# Patient Record
Sex: Female | Born: 1955 | Race: White | Hispanic: No | Marital: Married | Smoking: Never smoker
Health system: Southern US, Community
[De-identification: ages and names within clinical notes are randomized; demographics above are authoritative.]

## PROBLEM LIST (undated history)

## (undated) DIAGNOSIS — M1990 Unspecified osteoarthritis, unspecified site: Secondary | ICD-10-CM

## (undated) DIAGNOSIS — N871 Moderate cervical dysplasia: Secondary | ICD-10-CM

## (undated) DIAGNOSIS — K219 Gastro-esophageal reflux disease without esophagitis: Secondary | ICD-10-CM

## (undated) DIAGNOSIS — Z8489 Family history of other specified conditions: Secondary | ICD-10-CM

## (undated) DIAGNOSIS — C4490 Unspecified malignant neoplasm of skin, unspecified: Secondary | ICD-10-CM

## (undated) DIAGNOSIS — H9319 Tinnitus, unspecified ear: Secondary | ICD-10-CM

## (undated) HISTORY — DX: Tinnitus, unspecified ear: H93.19

## (undated) HISTORY — DX: Unspecified osteoarthritis, unspecified site: M19.90

## (undated) HISTORY — PX: TUBAL LIGATION: SHX77

## (undated) HISTORY — PX: SKIN CANCER EXCISION: SHX779

## (undated) HISTORY — DX: Moderate cervical dysplasia: N87.1

---

## 1992-03-09 DIAGNOSIS — N871 Moderate cervical dysplasia: Secondary | ICD-10-CM

## 1992-03-09 HISTORY — PX: CERVICAL BIOPSY  W/ LOOP ELECTRODE EXCISION: SUR135

## 1992-03-09 HISTORY — DX: Moderate cervical dysplasia: N87.1

## 1992-03-09 HISTORY — PX: CERVICAL CONE BIOPSY: SUR198

## 1998-03-09 HISTORY — PX: HYSTEROSCOPY: SHX211

## 1998-06-04 ENCOUNTER — Other Ambulatory Visit: Admission: RE | Admit: 1998-06-04 | Discharge: 1998-06-04 | Payer: Self-pay | Admitting: Obstetrics and Gynecology

## 1999-02-25 ENCOUNTER — Ambulatory Visit (HOSPITAL_COMMUNITY): Admission: RE | Admit: 1999-02-25 | Discharge: 1999-02-25 | Payer: Self-pay | Admitting: Gynecology

## 1999-02-25 ENCOUNTER — Encounter (INDEPENDENT_AMBULATORY_CARE_PROVIDER_SITE_OTHER): Payer: Self-pay

## 1999-07-29 ENCOUNTER — Other Ambulatory Visit: Admission: RE | Admit: 1999-07-29 | Discharge: 1999-07-29 | Payer: Self-pay | Admitting: Gynecology

## 2000-03-09 HISTORY — PX: AUGMENTATION MAMMAPLASTY: SUR837

## 2000-07-07 ENCOUNTER — Other Ambulatory Visit: Admission: RE | Admit: 2000-07-07 | Discharge: 2000-07-07 | Payer: Self-pay | Admitting: Gynecology

## 2001-07-14 ENCOUNTER — Other Ambulatory Visit: Admission: RE | Admit: 2001-07-14 | Discharge: 2001-07-14 | Payer: Self-pay | Admitting: Gynecology

## 2002-08-18 ENCOUNTER — Other Ambulatory Visit: Admission: RE | Admit: 2002-08-18 | Discharge: 2002-08-18 | Payer: Self-pay | Admitting: Gynecology

## 2003-09-18 ENCOUNTER — Other Ambulatory Visit: Admission: RE | Admit: 2003-09-18 | Discharge: 2003-09-18 | Payer: Self-pay | Admitting: Gynecology

## 2004-03-09 HISTORY — PX: KNEE ARTHROSCOPY: SUR90

## 2004-04-23 ENCOUNTER — Encounter: Admission: RE | Admit: 2004-04-23 | Discharge: 2004-04-23 | Payer: Self-pay | Admitting: Gynecology

## 2004-10-06 ENCOUNTER — Other Ambulatory Visit: Admission: RE | Admit: 2004-10-06 | Discharge: 2004-10-06 | Payer: Self-pay | Admitting: Gynecology

## 2004-12-03 ENCOUNTER — Ambulatory Visit (HOSPITAL_COMMUNITY): Admission: RE | Admit: 2004-12-03 | Discharge: 2004-12-03 | Payer: Self-pay | Admitting: Sports Medicine

## 2005-05-07 ENCOUNTER — Encounter: Admission: RE | Admit: 2005-05-07 | Discharge: 2005-05-07 | Payer: Self-pay | Admitting: Gynecology

## 2005-05-27 ENCOUNTER — Encounter: Admission: RE | Admit: 2005-05-27 | Discharge: 2005-05-27 | Payer: Self-pay | Admitting: Gynecology

## 2005-10-20 ENCOUNTER — Other Ambulatory Visit: Admission: RE | Admit: 2005-10-20 | Discharge: 2005-10-20 | Payer: Self-pay | Admitting: Gynecology

## 2005-11-10 ENCOUNTER — Encounter: Admission: RE | Admit: 2005-11-10 | Discharge: 2005-11-10 | Payer: Self-pay | Admitting: Gynecology

## 2006-11-15 ENCOUNTER — Encounter: Admission: RE | Admit: 2006-11-15 | Discharge: 2006-11-15 | Payer: Self-pay | Admitting: Obstetrics and Gynecology

## 2006-11-26 ENCOUNTER — Other Ambulatory Visit: Admission: RE | Admit: 2006-11-26 | Discharge: 2006-11-26 | Payer: Self-pay | Admitting: Obstetrics and Gynecology

## 2006-12-17 ENCOUNTER — Ambulatory Visit: Payer: Self-pay | Admitting: Internal Medicine

## 2006-12-30 ENCOUNTER — Ambulatory Visit: Payer: Self-pay | Admitting: Internal Medicine

## 2006-12-30 HISTORY — PX: COLONOSCOPY: SHX174

## 2007-11-17 ENCOUNTER — Encounter: Admission: RE | Admit: 2007-11-17 | Discharge: 2007-11-17 | Payer: Self-pay | Admitting: Obstetrics and Gynecology

## 2007-11-30 ENCOUNTER — Other Ambulatory Visit: Admission: RE | Admit: 2007-11-30 | Discharge: 2007-11-30 | Payer: Self-pay | Admitting: Obstetrics and Gynecology

## 2008-11-21 ENCOUNTER — Encounter: Admission: RE | Admit: 2008-11-21 | Discharge: 2008-11-21 | Payer: Self-pay | Admitting: Obstetrics and Gynecology

## 2009-11-25 ENCOUNTER — Encounter: Admission: RE | Admit: 2009-11-25 | Discharge: 2009-11-25 | Payer: Self-pay | Admitting: Obstetrics and Gynecology

## 2009-11-28 ENCOUNTER — Encounter: Admission: RE | Admit: 2009-11-28 | Discharge: 2009-11-28 | Payer: Self-pay | Admitting: Obstetrics and Gynecology

## 2009-11-28 IMAGING — US US BREAST L
1 series · 4 of 4 positions shown · non-contrast
Comparison: [DATE], [DATE], [DATE], [DATE],
[DATE].

CLINICAL DATA: Possible mass left breast identified on the implant
push back CC view of the recent screening mammogram.

DIGITAL DIAGNOSTIC LEFT MAMMOGRAM WITHOUT  CAD AND LEFT BREAST
ULTRASOUND:

[Series 1: us breast left · 4 of 4 slices shown]
[im 1/4]
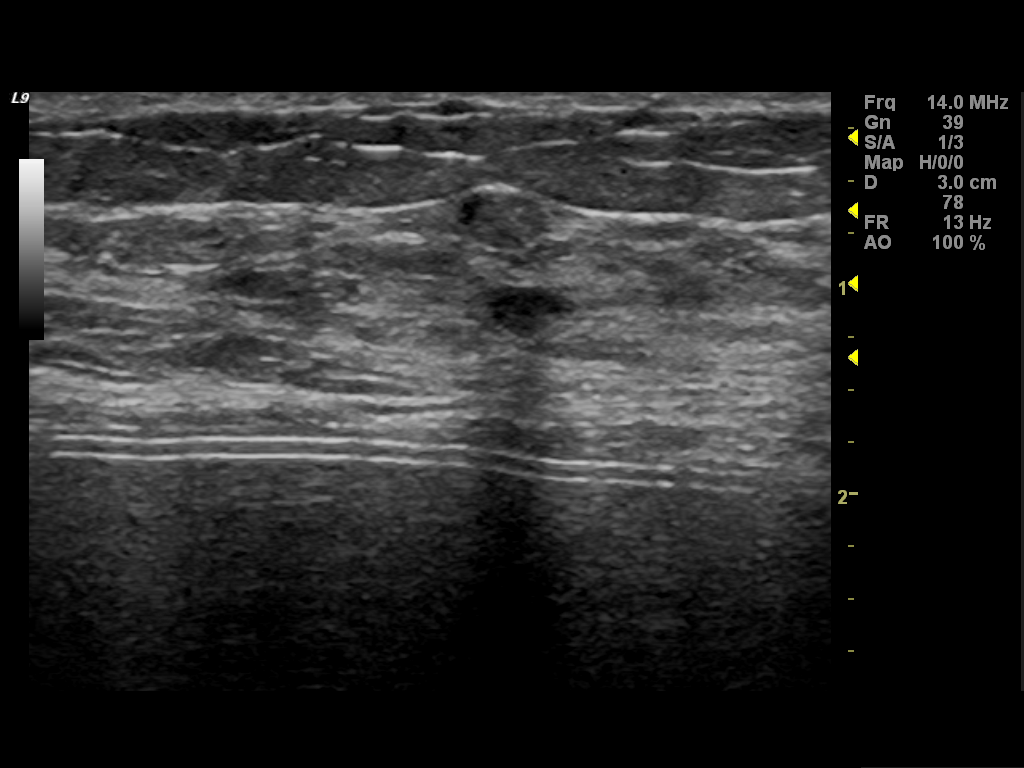
[im 2/4]
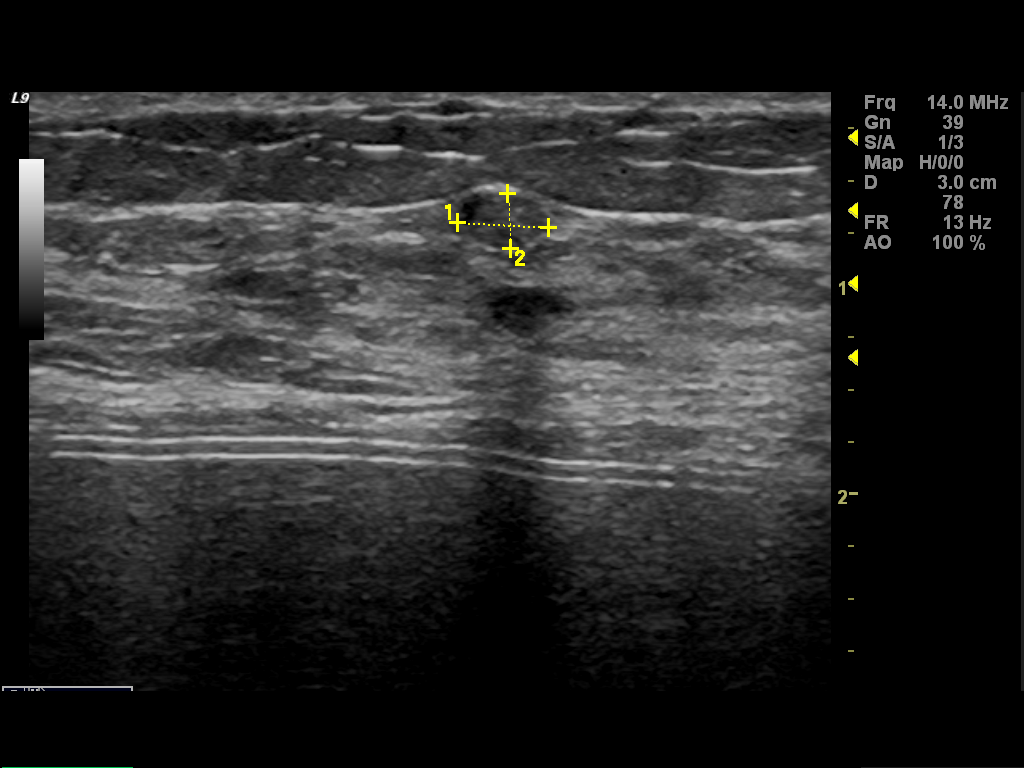
[im 3/4]
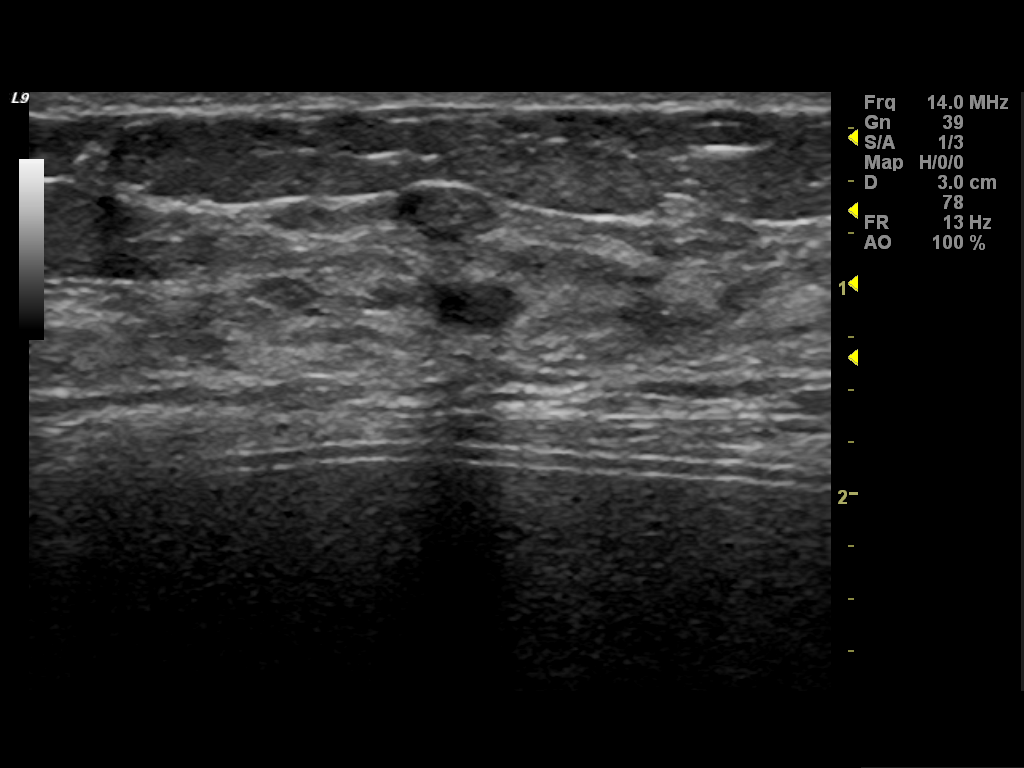
[im 4/4]
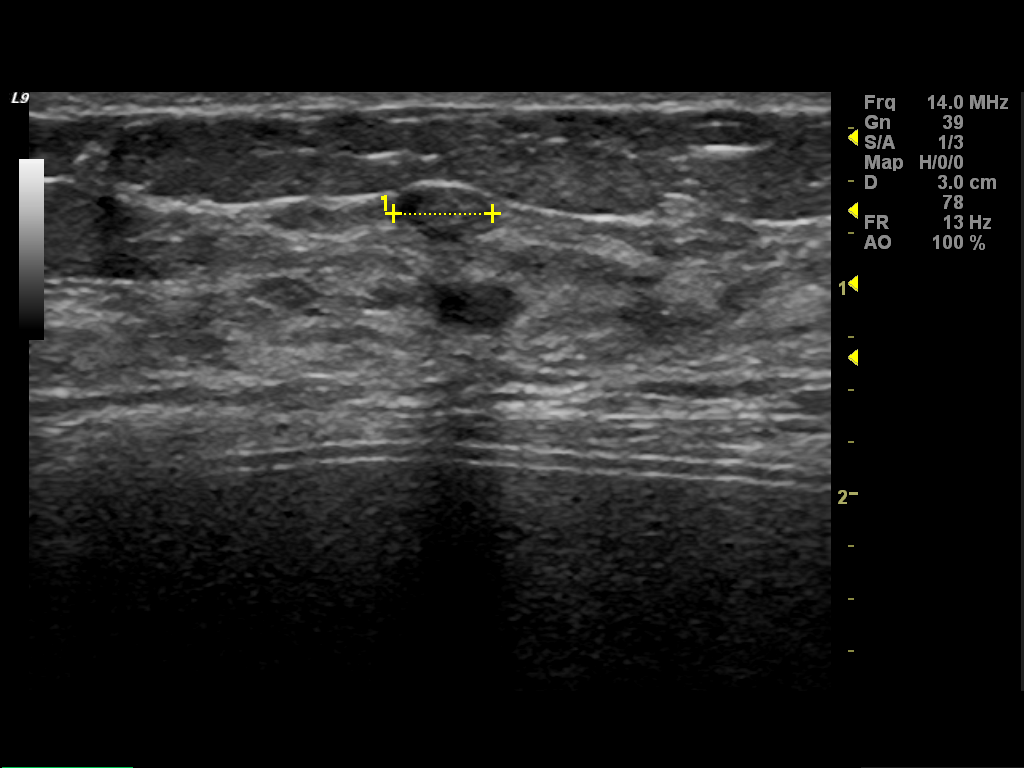

[4 of 4 positions shown; findings below may reference images not displayed]

FINDINGS: No focal spot compression view of the medial left
breast, no persistent mass is identified.  9 degrees lateral view,
no discrete mass is visualized.  Due to the patient's extremely
dense breast parenchyma, ultrasound is also performed.

On physical exam, no mass is palpated in the medial left breast.
Left breast is soft to palpation.

Ultrasound is performed, showing an oval circumscribed nearly
isoechoic nodule with two internal punctate hyperechogenicities,
likely calcifications, that measures 5 x 4 x 3 mm, at 9 o'clock
position 7 cm from the nipple.  This is thought to be an incidental
finding, unrelated to the nodular appearance seen on recent
mammogram.  An adjacent sub-centimeter cyst is imaged. No
suspicious mass is identified.
IMPRESSION: Probably benign 5 mm nodule 9 o'clock position left breast. Follow-
up left breast ultrasound in 6 months is recommended.

BI-RADS CATEGORY 3:  Probably benign finding(s) - short interval
follow-up suggested.

## 2010-04-28 ENCOUNTER — Other Ambulatory Visit: Payer: Self-pay | Admitting: Obstetrics and Gynecology

## 2010-04-28 DIAGNOSIS — Z09 Encounter for follow-up examination after completed treatment for conditions other than malignant neoplasm: Secondary | ICD-10-CM

## 2010-05-14 ENCOUNTER — Ambulatory Visit
Admission: RE | Admit: 2010-05-14 | Discharge: 2010-05-14 | Disposition: A | Discharge status: Home / Self Care | Payer: Managed Care, Other (non HMO) | Source: Ambulatory Visit | Attending: Obstetrics and Gynecology | Admitting: Obstetrics and Gynecology

## 2010-05-14 DIAGNOSIS — Z09 Encounter for follow-up examination after completed treatment for conditions other than malignant neoplasm: Secondary | ICD-10-CM

## 2010-10-13 ENCOUNTER — Other Ambulatory Visit: Payer: Self-pay | Admitting: Obstetrics and Gynecology

## 2010-10-13 DIAGNOSIS — Z1231 Encounter for screening mammogram for malignant neoplasm of breast: Secondary | ICD-10-CM

## 2010-12-01 ENCOUNTER — Ambulatory Visit
Admission: RE | Admit: 2010-12-01 | Discharge: 2010-12-01 | Disposition: A | Discharge status: Home / Self Care | Payer: Managed Care, Other (non HMO) | Source: Ambulatory Visit | Attending: Obstetrics and Gynecology | Admitting: Obstetrics and Gynecology

## 2010-12-01 DIAGNOSIS — Z1231 Encounter for screening mammogram for malignant neoplasm of breast: Secondary | ICD-10-CM

## 2011-11-11 ENCOUNTER — Other Ambulatory Visit: Payer: Self-pay | Admitting: Obstetrics and Gynecology

## 2011-11-11 DIAGNOSIS — Z1231 Encounter for screening mammogram for malignant neoplasm of breast: Secondary | ICD-10-CM

## 2011-12-02 ENCOUNTER — Ambulatory Visit
Admission: RE | Admit: 2011-12-02 | Discharge: 2011-12-02 | Disposition: A | Discharge status: Home / Self Care | Payer: Managed Care, Other (non HMO) | Source: Ambulatory Visit | Attending: Obstetrics and Gynecology | Admitting: Obstetrics and Gynecology

## 2011-12-02 DIAGNOSIS — Z1231 Encounter for screening mammogram for malignant neoplasm of breast: Secondary | ICD-10-CM

## 2011-12-30 LAB — HM PAP SMEAR

## 2012-12-19 ENCOUNTER — Other Ambulatory Visit: Payer: Self-pay

## 2012-12-19 DIAGNOSIS — Z1231 Encounter for screening mammogram for malignant neoplasm of breast: Secondary | ICD-10-CM

## 2012-12-19 DIAGNOSIS — Z9882 Breast implant status: Secondary | ICD-10-CM

## 2012-12-28 ENCOUNTER — Ambulatory Visit
Admission: RE | Admit: 2012-12-28 | Discharge: 2012-12-28 | Disposition: A | Discharge status: Home / Self Care | Payer: Managed Care, Other (non HMO) | Source: Ambulatory Visit

## 2012-12-28 DIAGNOSIS — Z9882 Breast implant status: Secondary | ICD-10-CM

## 2012-12-28 DIAGNOSIS — Z1231 Encounter for screening mammogram for malignant neoplasm of breast: Secondary | ICD-10-CM

## 2012-12-30 ENCOUNTER — Encounter: Payer: Self-pay | Admitting: Obstetrics and Gynecology

## 2012-12-30 ENCOUNTER — Ambulatory Visit (INDEPENDENT_AMBULATORY_CARE_PROVIDER_SITE_OTHER): Payer: PRIVATE HEALTH INSURANCE | Admitting: Obstetrics and Gynecology

## 2012-12-30 ENCOUNTER — Ambulatory Visit: Payer: Self-pay | Admitting: Obstetrics and Gynecology

## 2012-12-30 VITALS — BP 120/70 | HR 56 | Ht 69.0 in | Wt 162.5 lb

## 2012-12-30 DIAGNOSIS — Z01419 Encounter for gynecological examination (general) (routine) without abnormal findings: Secondary | ICD-10-CM

## 2012-12-30 DIAGNOSIS — N63 Unspecified lump in unspecified breast: Secondary | ICD-10-CM

## 2012-12-30 DIAGNOSIS — Z0000 Encounter for general adult medical examination without abnormal findings: Secondary | ICD-10-CM

## 2012-12-30 DIAGNOSIS — N6320 Unspecified lump in the left breast, unspecified quadrant: Secondary | ICD-10-CM

## 2012-12-30 LAB — COMPREHENSIVE METABOLIC PANEL
AST: 14 U/L (ref 0–37)
Alkaline Phosphatase: 68 U/L (ref 39–117)
BUN: 12 mg/dL (ref 6–23)
Calcium: 9.7 mg/dL (ref 8.4–10.5)
Chloride: 102 mEq/L (ref 96–112)
Creat: 0.57 mg/dL (ref 0.50–1.10)

## 2012-12-30 LAB — CBC
Hemoglobin: 13.2 g/dL (ref 12.0–15.0)
RBC: 4.21 MIL/uL (ref 3.87–5.11)

## 2012-12-30 LAB — POCT URINALYSIS DIPSTICK
Glucose, UA: NEGATIVE
Ketones, UA: NEGATIVE
Protein, UA: NEGATIVE

## 2012-12-30 LAB — HEMOGLOBIN, FINGERSTICK: Hemoglobin, fingerstick: 13.2 g/dL (ref 12.0–16.0)

## 2012-12-30 LAB — CHOLESTEROL, TOTAL: Cholesterol: 179 mg/dL (ref 0–200)

## 2012-12-30 MED ORDER — ESTRADIOL 2 MG VA RING: 2.0000 mg | VAGINAL_RING | VAGINAL | Status: DC

## 2012-12-30 NOTE — Progress Notes (Signed)
Patient ID: Sabrina Harper, female   DOB: 1955-07-16, 57 y.o.   MRN: [id number] GYNECOLOGY VISIT  PCP:  None  Referring provider:   HPI: 57 y.o.   Married  Caucasian  female   [phone] with Patient's last menstrual period was 03/09/2004.   here for  AEX.  No problems. Still having hot flashes. Took hormone therapy for 5 - 6 years.   Wants to continue with Estring.  Headaches with black cohosh.  Has breast implants.  Notes the left side is higher than the right.  Hgb:    13.2 Urine:   Neg  GYNECOLOGIC HISTORY: Patient's last menstrual period was 03/09/2004. Sexually active:  yes Partner preference: female Contraception:   Tubal Menopausal hormone therapy: n/a DES exposure:  no Blood transfusions:   no Sexually transmitted diseases:   no GYN Procedures:  C-section x2, LEEP 1994, Tubal, and Hysteoscopy with polyp resection 2000 Mammogram:   12-28-12 wnl:[facility].  Has breast implants             Pap:   12-30-11 wnl:no HR HPV done History of abnormal pap smear:  1994 LEEP - CIN II.  Paps normal since.   OB History   Grav Para Term Preterm Abortions TAB SAB Ect Mult Living   3 3 3       3        LIFESTYLE: Exercise:     Physical work and walking          Tobacco:     no Alcohol:        2-3 glasses of wine per wek Drug use:     no  OTHER HEALTH MAINTENANCE: Tetanus/TDap:   11/2006 Gardisil:  NA Influenza:    6 years ago.  Will do flu vaccine.  Zostavax:  NA  Bone density:  never Colonoscopy:  12/2006 [id number].  Next colonoscopy due 12/2016  Cholesterol check:   unsure  Family History  Problem Relation Age of Onset  . Osteoporosis Mother   . Hypertension Mother   . Kidney cancer Father     deceased  . Hypertension Maternal Grandmother   . Heart attack Brother   . Hypertension Brother   . Hypertension Brother     There are no active problems to display for this patient.  Past Medical History  Diagnosis Date  . CIN II (cervical  intraepithelial neoplasia II) 1994    Past Surgical History  Procedure Laterality Date  . Hysteroscopy  2000    resection of polyp  . Cervical biopsy  w/ loop electrode excision  1994    CIN II  . Cesarean section      x2  . Augmentation mammaplasty  2002    -saline  . Knee arthroscopy Right 2006  . Tubal ligation      ALLERGIES: Macrobid  Current Outpatient Prescriptions  Medication Sig Dispense Refill  . calcium carbonate (OS-CAL) 600 MG TABS tablet Take 600 mg by mouth 2 (two) times daily with a meal.      . estradiol (ESTRING) 2 MG vaginal ring Place 2 mg vaginally every 3 (three) months. follow package directions      . vitamin B-12 (CYANOCOBALAMIN) 100 MCG tablet Take 50 mcg by mouth daily.       No current facility-administered medications for this visit.     ROS:  Pertinent items are noted in HPI.  SOCIAL HISTORY:  Home maker.  Retired Technical sales engineer.  3 sons, 2 married.  Expecting first  grandchild - boy. Has land in [place].  PHYSICAL EXAMINATION:    BP 120/70  Pulse 56  Ht 5\' 9"  (1.753 m)  Wt 162 lb 8 oz (73.71 kg)  BMI 23.99 kg/m2  LMP 03/09/2004   Wt Readings from Last 3 Encounters:  12/30/12 162 lb 8 oz (73.71 kg)     Ht Readings from Last 3 Encounters:  12/30/12 5\' 9"  (1.753 m)    General appearance: alert, cooperative and appears stated age Head: Normocephalic, without obvious abnormality, atraumatic Neck: no adenopathy, supple, symmetrical, trachea midline and thyroid not enlarged, symmetric, no tenderness/mass/nodules Lungs: clear to auscultation bilaterally Breasts: Inspection negative, No nipple retraction or dimpling, No nipple discharge or bleeding, No axillary or supraclavicular adenopathy, Normal to palpation without dominant masses of right.  Left breast from 10 - 2 o'clock has a 3 cm oval shaped nontender, mobile mass.  (States that this is not a new change and has had an ultrasound of this area in the past.) Heart: regular rate and  rhythm Abdomen: soft, non-tender; no masses,  no organomegaly Extremities: extremities normal, atraumatic, no cyanosis or edema Skin: Skin color, texture, turgor normal. No rashes or lesions Lymph nodes: Cervical, supraclavicular, and axillary nodes normal. No abnormal inguinal nodes palpated Neurologic: Grossly normal  Pelvic: External genitalia:  no lesions              Urethra:  normal appearing urethra with no masses, tenderness or lesions              Bartholins and Skenes: normal                 Vagina: normal appearing vagina with normal color and discharge, no lesions              Cervix: normal appearance              Pap and high risk HPV testing done: yes.            Bimanual Exam:  Uterus:  uterus is normal size, shape, consistency and nontender.  Estring palpable.                                      Adnexa: normal adnexa in size, nontender and no masses                                      Rectovaginal: Confirms                                      Anus:  normal sphincter tone, no lesions  ASSESSMENT  Left breast mass. Bilateral breast implants. History of CIN II. Menopausal symptoms.  Vaginal atrophy.  PLAN  Diagnostic mammogram and ultrasound of left breast. Pap smear and high risk HPV testing Counseled on  Soy use for menopausal symptoms. T chol, CMP, CBC. Refill Estring for one year.  Will re-evaluate this after mammogram and ultrasound are done.  Medications per Epic orders Return annually or prn   An After Visit Summary was printed and given to the patient.

## 2012-12-30 NOTE — Patient Instructions (Signed)

## 2012-12-30 NOTE — Progress Notes (Signed)
Appointment made for Left 3D Diagnostic Mammogram and L Breast U/S . Appointment made with patient in office for  01/10/13 at 1230 at [facility] patient agreeable to time/date.

## 2013-01-03 LAB — IPS PAP TEST WITH HPV

## 2013-01-10 ENCOUNTER — Ambulatory Visit
Admission: RE | Admit: 2013-01-10 | Discharge: 2013-01-10 | Disposition: A | Discharge status: Home / Self Care | Payer: PRIVATE HEALTH INSURANCE | Source: Ambulatory Visit | Attending: Obstetrics and Gynecology | Admitting: Obstetrics and Gynecology

## 2013-01-10 DIAGNOSIS — N6320 Unspecified lump in the left breast, unspecified quadrant: Secondary | ICD-10-CM

## 2013-01-12 ENCOUNTER — Other Ambulatory Visit: Payer: Self-pay

## 2013-10-23 ENCOUNTER — Telehealth: Payer: Self-pay | Admitting: Obstetrics and Gynecology

## 2013-10-23 NOTE — Telephone Encounter (Signed)
Pt cut her leg on yesterday and last tetnus was 2008. Wondering if she should get another one.

## 2013-10-23 NOTE — Telephone Encounter (Signed)
Spoke with patient. Advised patient that tetanus shots are good for 10 years. Patient states last tetanus was in 2008. Advised patient she is still covered and will not need another until 2018. Patient states she is at urgent care and is going to have her leg looked at now. Verbalizes understanding.  Routing to provider for final review. Patient agreeable to disposition. Will close encounter

## 2013-11-07 ENCOUNTER — Encounter: Payer: Self-pay | Admitting: Obstetrics and Gynecology

## 2013-11-28 ENCOUNTER — Other Ambulatory Visit: Payer: Self-pay

## 2013-11-28 DIAGNOSIS — Z1231 Encounter for screening mammogram for malignant neoplasm of breast: Secondary | ICD-10-CM

## 2014-01-01 ENCOUNTER — Ambulatory Visit
Admission: RE | Admit: 2014-01-01 | Discharge: 2014-01-01 | Disposition: A | Discharge status: Home / Self Care | Payer: PRIVATE HEALTH INSURANCE | Source: Ambulatory Visit

## 2014-01-01 ENCOUNTER — Ambulatory Visit: Payer: PRIVATE HEALTH INSURANCE | Admitting: Obstetrics and Gynecology

## 2014-01-01 DIAGNOSIS — Z1231 Encounter for screening mammogram for malignant neoplasm of breast: Secondary | ICD-10-CM

## 2014-01-03 ENCOUNTER — Encounter: Payer: Self-pay | Admitting: Obstetrics and Gynecology

## 2014-01-03 ENCOUNTER — Ambulatory Visit (INDEPENDENT_AMBULATORY_CARE_PROVIDER_SITE_OTHER): Payer: PRIVATE HEALTH INSURANCE | Admitting: Obstetrics and Gynecology

## 2014-01-03 VITALS — BP 120/70 | HR 60 | Resp 16 | Ht 69.0 in | Wt 167.0 lb

## 2014-01-03 DIAGNOSIS — Z0000 Encounter for general adult medical examination without abnormal findings: Secondary | ICD-10-CM

## 2014-01-03 DIAGNOSIS — Z01419 Encounter for gynecological examination (general) (routine) without abnormal findings: Secondary | ICD-10-CM

## 2014-01-03 LAB — CBC
HEMATOCRIT: 39.9 % (ref 36.0–46.0)
HEMOGLOBIN: 13.2 g/dL (ref 12.0–15.0)
MCH: 31.4 pg (ref 26.0–34.0)
MCHC: 33.1 g/dL (ref 30.0–36.0)
MCV: 94.8 fL (ref 78.0–100.0)
Platelets: 241 10*3/uL (ref 150–400)
RBC: 4.21 MIL/uL (ref 3.87–5.11)
RDW: 13 % (ref 11.5–15.5)
WBC: 5.4 10*3/uL (ref 4.0–10.5)

## 2014-01-03 LAB — POCT URINALYSIS DIPSTICK
Bilirubin, UA: NEGATIVE
Blood, UA: NEGATIVE
Glucose, UA: NEGATIVE
Ketones, UA: NEGATIVE
LEUKOCYTES UA: NEGATIVE
Nitrite, UA: NEGATIVE
Protein, UA: NEGATIVE
UROBILINOGEN UA: NEGATIVE
pH, UA: 5

## 2014-01-03 LAB — HEMOGLOBIN, FINGERSTICK: HEMOGLOBIN, FINGERSTICK: 13.3 g/dL (ref 12.0–16.0)

## 2014-01-03 MED ORDER — ESTRADIOL 2 MG VA RING: 2.0000 mg | VAGINAL_RING | VAGINAL | Status: DC

## 2014-01-03 NOTE — Progress Notes (Signed)
Patient ID: Sabrina Harper, female   DOB: June 17, 1955, 58 y.o.   MRN: [id number] 58 y.o. [id number] MarriedCaucasianF here for annual exam.   Gained 5 pounds.  No regular exercise.   Happy with Estring.   58 month old and 42 1/2 month old grandsons.   Helps to care for them.   PCP:  None  Patient's last menstrual period was 03/09/2004.          Sexually active: Yes.   female The current method of family planning is tubal ligation.    Exercising: Yes.    walking, yard work, and gardening Smoker:  no  Health Maintenance: Pap:  12-30-12 wnl:neg HR HPV History of abnormal Pap:  Yes, Hx LEEP 1994 -- CIN II.  Paps normal since. MMG: 01-01-14 extremely dense/pt. With implants/normal: [facility]  Colonoscopy:  12/2006 [id number].  Next colonoscopy due 12/2016. BMD:   2005 Done at [facility]:  2015 Screening;  Hb today: 13.3, Urine today: Neg   reports that she has never smoked. She does not have any smokeless tobacco history on file. She reports that she drinks about .5 ounces of alcohol per week. She reports that she does not use illicit drugs.  Past Medical History  Diagnosis Date  . CIN II (cervical intraepithelial neoplasia II) 1994    Past Surgical History  Procedure Laterality Date  . Hysteroscopy  2000    resection of polyp  . Cervical biopsy  w/ loop electrode excision  1994    CIN II  . Cesarean section      x2  . Augmentation mammaplasty  2002    -saline  . Knee arthroscopy Right 2006  . Tubal ligation      Current Outpatient Prescriptions  Medication Sig Dispense Refill  . calcium carbonate (OS-CAL) 600 MG TABS tablet Take 600 mg by mouth 2 (two) times daily with a meal.      . estradiol (ESTRING) 2 MG vaginal ring Place 2 mg vaginally every 3 (three) months. follow package directions  1 each  3  . Multiple Vitamin (MULTIVITAMIN) capsule Take 1 capsule by mouth daily.       No current facility-administered medications for this visit.     Family History  Problem Relation Age of Onset  . Osteoporosis Mother   . Hypertension Mother   . Kidney cancer Father     deceased  . Hypertension Maternal Grandmother   . Heart attack Brother 23  . Hypertension Brother   . Hypertension Brother     ROS:  Pertinent items are noted in HPI.  Otherwise, a comprehensive ROS was negative.  Exam:   BP 120/70  Pulse 60  Resp 16  Ht 5\' 9"  (1.753 m)  Wt 167 lb (75.751 kg)  BMI 24.65 kg/m2  LMP 03/09/2004    Height: 5\' 9"  (175.3 cm)  Ht Readings from Last 3 Encounters:  01/03/14 5\' 9"  (1.753 m)  12/30/12 5\' 9"  (1.753 m)    General appearance: alert, cooperative and appears stated age Head: Normocephalic, without obvious abnormality, atraumatic Neck: no adenopathy, supple, symmetrical, trachea midline and thyroid normal to inspection and palpation Lungs: clear to auscultation bilaterally Breasts: normal appearance, no masses or tenderness, Inspection negative, No nipple retraction or dimpling, No nipple discharge or bleeding, No axillary or supraclavicular adenopathy.  Bilateral implants.  Heart: regular rate and rhythm  Abdomen: soft, non-tender; bowel sounds normal; no masses,  no organomegaly Extremities: extremities normal, atraumatic, no cyanosis or edema  Skin: Skin color, texture, turgor normal. No rashes or lesions Lymph nodes: Cervical, supraclavicular, and axillary nodes normal. No abnormal inguinal nodes palpated Neurologic: Grossly normal   Pelvic: External genitalia:  no lesions              Urethra:  normal appearing urethra with no masses, tenderness or lesions              Bartholins and Skenes: normal                 Vagina: normal appearing vagina with normal color and discharge, no lesions              Cervix: no lesions.  Estring in place.               Pap taken: No. Bimanual Exam:  Uterus:  normal size, contour, position, consistency, mobility, non-tender              Adnexa: no mass, fullness,  tenderness               Rectovaginal: Confirms               Anus:  normal sphincter tone, no lesions  A:  Well Woman with normal exam History of LEEP. Vaginal atrophy.  Estring works well.   P:   Mammogram yearly.  I recommend 3D. pap smear next year.  Continue Estring - discussed risks of cardiovascular events and breast cancer.  Routine labs.  Discussed exercise.  return annually or prn  An After Visit Summary was printed and given to the patient.

## 2014-01-03 NOTE — Patient Instructions (Signed)

## 2014-01-04 ENCOUNTER — Telehealth: Payer: Self-pay | Admitting: Obstetrics and Gynecology

## 2014-01-04 LAB — COMPREHENSIVE METABOLIC PANEL
ALT: 20 U/L (ref 0–35)
AST: 14 U/L (ref 0–37)
Albumin: 4.6 g/dL (ref 3.5–5.2)
Alkaline Phosphatase: 80 U/L (ref 39–117)
BILIRUBIN TOTAL: 0.2 mg/dL (ref 0.2–1.2)
BUN: 15 mg/dL (ref 6–23)
CO2: 27 mEq/L (ref 19–32)
Calcium: 9.7 mg/dL (ref 8.4–10.5)
Chloride: 102 mEq/L (ref 96–112)
Creat: 0.56 mg/dL (ref 0.50–1.10)
GLUCOSE: 93 mg/dL (ref 70–99)
Potassium: 3.9 mEq/L (ref 3.5–5.3)
Sodium: 139 mEq/L (ref 135–145)
Total Protein: 7.3 g/dL (ref 6.0–8.3)

## 2014-01-04 LAB — LIPID PANEL
CHOLESTEROL: 188 mg/dL (ref 0–200)
HDL: 49 mg/dL (ref >39)
LDL Cholesterol: 101 mg/dL — ABNORMAL HIGH (ref 0–99)
TRIGLYCERIDES: 189 mg/dL — AB (ref <150)
Total CHOL/HDL Ratio: 3.8 Ratio
VLDL: 38 mg/dL (ref 0–40)

## 2014-01-04 NOTE — Telephone Encounter (Signed)
Patient calling with questions about her AVS printed at her visit yesterday. She says she gave the doctor her information that her TDAP was done last month, 12/2013, but her AVS does not reflect that. Patient also has questions about labs that were drawn yesterday.

## 2014-01-04 NOTE — Telephone Encounter (Signed)
Patient is calling to ensure our office does not charge her both a hemoglobin fingerstick and a CBC. 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 Advised patient that the fingerstick charge usually represents the venipuncture and not a charge for hemoglobin separately. I advised patient that I would sent this message to both Dr. Quincy Simmonds and billing department. Would ask billing department to review charges and return call to patient. She is agreeable.   Entered tdap with patient report of date.   Routing to [employer] and Dr. Quincy Simmonds.

## 2014-01-04 NOTE — Telephone Encounter (Signed)
Please inform patient that we will remove charge for hemoglobin.  Sabrina Harper

## 2014-01-05 NOTE — Telephone Encounter (Signed)
I discussed this with Caryl Pina (since we would be removing a lab charge). She was unaware and followed up with Gay Filler to advise.

## 2014-01-08 ENCOUNTER — Encounter: Payer: Self-pay | Admitting: Obstetrics and Gynecology

## 2014-01-09 NOTE — Telephone Encounter (Signed)
I have advised Caryl Pina in the lab that the HGB charge will need to be billed to the office. The CBC will be billed to the patient. Please notify patient and then close encounter.

## 2014-01-09 NOTE — Telephone Encounter (Signed)
Do you know how this case was resolved?

## 2014-01-10 NOTE — Telephone Encounter (Signed)
I contacted the patient to advise about the HBG not being billed to her insurance on her last visit. The patient was already aware and thanked me for calling her.

## 2014-12-04 ENCOUNTER — Other Ambulatory Visit: Payer: Self-pay

## 2014-12-04 DIAGNOSIS — Z1231 Encounter for screening mammogram for malignant neoplasm of breast: Secondary | ICD-10-CM

## 2015-01-03 ENCOUNTER — Ambulatory Visit
Admission: RE | Admit: 2015-01-03 | Discharge: 2015-01-03 | Disposition: A | Discharge status: Home / Self Care | Payer: PRIVATE HEALTH INSURANCE | Source: Ambulatory Visit

## 2015-01-03 DIAGNOSIS — Z1231 Encounter for screening mammogram for malignant neoplasm of breast: Secondary | ICD-10-CM

## 2015-01-09 ENCOUNTER — Ambulatory Visit (INDEPENDENT_AMBULATORY_CARE_PROVIDER_SITE_OTHER): Payer: PRIVATE HEALTH INSURANCE | Admitting: Obstetrics and Gynecology

## 2015-01-09 ENCOUNTER — Encounter: Payer: Self-pay | Admitting: Obstetrics and Gynecology

## 2015-01-09 ENCOUNTER — Other Ambulatory Visit (INDEPENDENT_AMBULATORY_CARE_PROVIDER_SITE_OTHER): Payer: PRIVATE HEALTH INSURANCE

## 2015-01-09 VITALS — BP 110/72 | HR 76 | Resp 18 | Ht 69.0 in | Wt 165.4 lb

## 2015-01-09 DIAGNOSIS — Z01419 Encounter for gynecological examination (general) (routine) without abnormal findings: Secondary | ICD-10-CM

## 2015-01-09 DIAGNOSIS — Z0000 Encounter for general adult medical examination without abnormal findings: Secondary | ICD-10-CM

## 2015-01-09 LAB — CBC
HCT: 39.9 % (ref 36.0–46.0)
Hemoglobin: 13.5 g/dL (ref 12.0–15.0)
MCH: 31.7 pg (ref 26.0–34.0)
MCHC: 33.8 g/dL (ref 30.0–36.0)
MCV: 93.7 fL (ref 78.0–100.0)
MPV: 10 fL (ref 8.6–12.4)
PLATELETS: 251 10*3/uL (ref 150–400)
RBC: 4.26 MIL/uL (ref 3.87–5.11)
RDW: 13.4 % (ref 11.5–15.5)
WBC: 5.1 10*3/uL (ref 4.0–10.5)

## 2015-01-09 LAB — POCT URINALYSIS DIPSTICK
Bilirubin, UA: NEGATIVE
Glucose, UA: NEGATIVE
Ketones, UA: NEGATIVE
Leukocytes, UA: NEGATIVE
NITRITE UA: NEGATIVE
Protein, UA: NEGATIVE
RBC UA: NEGATIVE
UROBILINOGEN UA: NEGATIVE
pH, UA: 5

## 2015-01-09 LAB — COMPREHENSIVE METABOLIC PANEL
ALBUMIN: 4.4 g/dL (ref 3.6–5.1)
ALT: 23 U/L (ref 6–29)
AST: 20 U/L (ref 10–35)
Alkaline Phosphatase: 74 U/L (ref 33–130)
BILIRUBIN TOTAL: 0.4 mg/dL (ref 0.2–1.2)
BUN: 16 mg/dL (ref 7–25)
CALCIUM: 9.4 mg/dL (ref 8.6–10.4)
CHLORIDE: 103 mmol/L (ref 98–110)
CO2: 28 mmol/L (ref 20–31)
Creat: 0.61 mg/dL (ref 0.50–1.05)
Glucose, Bld: 97 mg/dL (ref 65–99)
Potassium: 4.3 mmol/L (ref 3.5–5.3)
Sodium: 140 mmol/L (ref 135–146)
Total Protein: 7.2 g/dL (ref 6.1–8.1)

## 2015-01-09 LAB — LIPID PANEL
CHOL/HDL RATIO: 3.2 ratio (ref <=5.0)
CHOLESTEROL: 196 mg/dL (ref 125–200)
HDL: 61 mg/dL (ref >=46)
LDL Cholesterol: 125 mg/dL (ref <130)
Triglycerides: 50 mg/dL (ref <150)
VLDL: 10 mg/dL (ref <30)

## 2015-01-09 LAB — HEMOGLOBIN, FINGERSTICK: HEMOGLOBIN, FINGERSTICK: 13.3 g/dL (ref 12.0–16.0)

## 2015-01-09 MED ORDER — ESTRADIOL 2 MG VA RING: 2.0000 mg | VAGINAL_RING | VAGINAL | Status: DC

## 2015-01-09 NOTE — Patient Instructions (Signed)

## 2015-01-09 NOTE — Progress Notes (Signed)
Patient ID: Sabrina Harper, female   DOB: 12-Dec-1955, 59 y.o.   MRN: [id number] 59 y.o. [id number] Married Caucasian female here for annual exam.    Had blood drawn this am fasting.   Using Estring which is successful for treating atrophy symptoms.   Doing heavy yard work.  Has acreage to take care of.  Had a tractor hurt her right leg one year ago.  Asking about signs and symptoms of DVT.   PCP:   None  Patient's last menstrual period was 03/09/2004.          Sexually active: Yes.   female The current method of family planning is tubal ligation.    Exercising: Yes.    Walks and works outside all the time--pt.has 40 acres. Smoker:  no  Health Maintenance: Pap:  12-30-12 Neg:Neg HR HPV History of abnormal Pap:  Yes, LEEP procedure 1994 - CIN II.  Paps normal since. MMG:  01-03-15 Saline Implants,3D/Neg/BiRads1:[facility]. Colonoscopy:  12/2006 normal with [facility]. Next due 12/2016. BMD:   2004  Result  Normal--Dr. Toney Rakes TDaP:  2016 Screening Labs:  Hb today: 13.3, Urine today: Neg   reports that she has never smoked. She does not have any smokeless tobacco history on file. She reports that she drinks about 1.2 oz of alcohol per week. She reports that she does not use illicit drugs.  Past Medical History  Diagnosis Date  . CIN II (cervical intraepithelial neoplasia II) 1994    Past Surgical History  Procedure Laterality Date  . Hysteroscopy  2000    resection of polyp  . Cervical biopsy  w/ loop electrode excision  1994    CIN II  . Cesarean section      x2  . Augmentation mammaplasty  2002    -saline  . Knee arthroscopy Right 2006  . Tubal ligation      Current Outpatient Prescriptions  Medication Sig Dispense Refill  . calcium carbonate (OS-CAL) 600 MG TABS tablet Take 600 mg by mouth 2 (two) times daily with a meal.    . estradiol (ESTRING) 2 MG vaginal ring Place 2 mg vaginally every 3 (three) months. follow package directions 1 each 3  . Multiple  Vitamin (MULTIVITAMIN) capsule Take 1 capsule by mouth daily.    . diclofenac sodium (VOLTAREN) 1 % GEL Apply 1 application topically as needed.  0   No current facility-administered medications for this visit.    Family History  Problem Relation Age of Onset  . Osteoporosis Mother   . Hypertension Mother   . Kidney cancer Father     deceased  . Hypertension Maternal Grandmother   . Heart attack Brother 91  . Hypertension Brother   . Hypertension Brother     ROS:  Pertinent items are noted in HPI.  Otherwise, a comprehensive ROS was negative.  Exam:   BP 110/72 mmHg  Pulse 76  Resp 18  Ht 5\' 9"  (1.753 m)  Wt 165 lb 6.4 oz (75.025 kg)  BMI 24.41 kg/m2  LMP 03/09/2004    General appearance: alert, cooperative and appears stated age Head: Normocephalic, without obvious abnormality, atraumatic Neck: no adenopathy, supple, symmetrical, trachea midline and thyroid normal to inspection and palpation Lungs: clear to auscultation bilaterally Breasts: normal appearance, no masses or tenderness, Inspection negative, No nipple retraction or dimpling, No nipple discharge or bleeding, No axillary or supraclavicular adenopathy.  Consistent with bilateral implants. Heart: regular rate and rhythm Abdomen: soft, non-tender; bowel sounds normal; no  masses,  no organomegaly Extremities: extremities normal, atraumatic, no cyanosis or edema Skin: Skin color, texture, turgor normal. No rashes or lesions Lymph nodes: Cervical, supraclavicular, and axillary nodes normal. No abnormal inguinal nodes palpated Neurologic: Grossly normal  Pelvic: External genitalia:  no lesions              Urethra:  normal appearing urethra with no masses, tenderness or lesions              Bartholins and Skenes: normal                 Vagina: normal appearing vagina with normal color and discharge, no lesions              Cervix: no lesions              Pap taken: Yes.   Bimanual Exam:  Uterus:  normal size,  contour, position, consistency, mobility, non-tender              Adnexa: normal adnexa and no mass, fullness, tenderness              Rectovaginal: Yes.  .  Confirms.              Anus:  normal sphincter tone, no lesions  Chaperone was present for exam.  Assessment:   Well woman visit with normal exam. History of LEEP. Vaginal atrophy. Estring works well.   Plan: Yearly mammogram recommended after age 18.  Recommended self breast exam.  Pap and HR HPV as above. Reviewed Calcium, Vitamin D, regular exercise program including cardiovascular and weight bearing exercise. Labs performed.  Yes.  .   See orders. Routine labs drawn.  Refills given on medications.  Yes.  .  See orders.   Estring refilled for one year.  Discussed risks of DVT, PE, MI, stroke, and breast cancer.  Discussed signs and symptoms of DVT.  Follow up annually and prn.   After visit summary provided.

## 2015-01-10 LAB — TSH: TSH: 1.492 u[IU]/mL (ref 0.350–4.500)

## 2015-01-10 LAB — VITAMIN D 25 HYDROXY (VIT D DEFICIENCY, FRACTURES): Vit D, 25-Hydroxy: 32 ng/mL (ref 30–100)

## 2015-01-16 LAB — IPS PAP TEST WITH HPV

## 2015-12-17 ENCOUNTER — Other Ambulatory Visit: Payer: Self-pay | Admitting: Obstetrics and Gynecology

## 2015-12-17 DIAGNOSIS — Z1231 Encounter for screening mammogram for malignant neoplasm of breast: Secondary | ICD-10-CM

## 2016-01-08 ENCOUNTER — Ambulatory Visit: Payer: PRIVATE HEALTH INSURANCE

## 2016-01-16 ENCOUNTER — Ambulatory Visit: Payer: PRIVATE HEALTH INSURANCE | Admitting: Obstetrics and Gynecology

## 2016-01-16 ENCOUNTER — Ambulatory Visit: Payer: PRIVATE HEALTH INSURANCE

## 2016-01-24 ENCOUNTER — Ambulatory Visit: Payer: PRIVATE HEALTH INSURANCE | Admitting: Obstetrics and Gynecology

## 2016-02-24 NOTE — Progress Notes (Signed)
60 y.o. G45P3003 Married Caucasian female here for annual exam.    Hot flashes. Mild. Has headaches with Black Cohosh.   Some decreased libido.  Stopped Estring due to cost.  Does not want to restart.  Declines routine labs.  Has right knee pain due to arthritis.  Takes ibuprofen pm nightly for months.  Saw orthopedist last year. Using Voltaren gel.   Moved.  Building a new home.  Caring for grandchildren.  PCP:   None  Patient's last menstrual period was 03/09/2004.           Sexually active: Yes.   female The current method of family planning is tubal ligation.    Exercising: Yes.    Walking and weights Smoker:  no  Health Maintenance: Pap:  01-09-15 Neg:Neg HR HPV History of abnormal Pap:  yes MMG:  01-03-15 Bil.Saline Implants 3D/Density D/Neg/BiRads1:[facility]--Appt. 02-28-16 Colonoscopy:  12/2006 normal with [id number] due 12/2016. BMD:   2004  Result  Normal--Dr.Fernandez TDaP:  2016 Gardasil:   N/A HIV:  Negative in past. Hep C:  Will do today. Screening Labs:   Urine today: Neg   reports that she has never smoked. She has never used smokeless tobacco. She reports that she drinks about 1.2 oz of alcohol per week . She reports that she does not use drugs.  Past Medical History:  Diagnosis Date  . CIN II (cervical intraepithelial neoplasia II) 1994    Past Surgical History:  Procedure Laterality Date  . AUGMENTATION MAMMAPLASTY  2002   -saline  . CERVICAL BIOPSY  W/ LOOP ELECTRODE EXCISION  1994   CIN II  . CESAREAN SECTION     x2  . HYSTEROSCOPY  2000   resection of polyp  . KNEE ARTHROSCOPY Right 2006  . TUBAL LIGATION      Current Outpatient Prescriptions  Medication Sig Dispense Refill  . calcium carbonate (OS-CAL) 600 MG TABS tablet Take 600 mg by mouth 2 (two) times daily with a meal.    . Multiple Vitamin (MULTIVITAMIN) capsule Take 1 capsule by mouth daily.    Marland Kitchen estradiol (ESTRING) 2 MG vaginal ring Place 2 mg vaginally every 3 (three)  months. follow package directions (Patient not taking: Reported on 02/26/2016) 1 each 3   No current facility-administered medications for this visit.     Family History  Problem Relation Age of Onset  . Osteoporosis Mother   . Hypertension Mother   . Kidney cancer Father     deceased  . Heart attack Brother 3  . Hypertension Brother   . Hypertension Brother   . Hypertension Maternal Grandmother     ROS:  Pertinent items are noted in HPI.  Otherwise, a comprehensive ROS was negative.  Exam:   BP 122/82 (BP Location: Right Arm, Patient Position: Sitting, Cuff Size: Normal)   Pulse 60   Resp 16   Ht 5' 9.5" (1.765 m)   Wt 161 lb 12.8 oz (73.4 kg)   LMP 03/09/2004   BMI 23.55 kg/m     General appearance: alert, cooperative and appears stated age Head: Normocephalic, without obvious abnormality, atraumatic Neck: no adenopathy, supple, symmetrical, trachea midline and thyroid normal to inspection and palpation Lungs: clear to auscultation bilaterally Breasts: bilateral implants, normal appearance, no masses or tenderness, No nipple retraction or dimpling, No nipple discharge or bleeding, No axillary or supraclavicular adenopathy Heart: regular rate and rhythm Abdomen: soft, non-tender; no masses, no organomegaly Extremities: extremities normal, atraumatic, no cyanosis or edema Skin: Skin color,  texture, turgor normal. No rashes or lesions Lymph nodes: Cervical, supraclavicular, and axillary nodes normal. No abnormal inguinal nodes palpated Neurologic: Grossly normal  Pelvic: External genitalia:  no lesions              Urethra:  normal appearing urethra with no masses, tenderness or lesions              Bartholins and Skenes: normal                 Vagina: normal appearing vagina with normal color and discharge, no lesions              Cervix: no lesions              Pap taken:  No. Bimanual Exam:  Uterus:  normal size, contour, position, consistency, mobility, non-tender               Adnexa: no mass, fullness, tenderness              Rectal exam: Yes.  .  Confirms.              Anus:  normal sphincter tone, no lesions  Chaperone was present for exam.  Assessment:   Well woman visit with normal exam. Bilateral breast implants. Remote hx CIN II. Menopausal symptoms.   Plan: Mammogram discussed. Recommended self breast awareness. Pap and HR HPV as above. Guidelines for Calcium, Vitamin D, regular exercise program including cardiovascular and weight bearing exercise. Hep C aby.  Try soy products. Follow up annually and prn.      After visit summary provided.

## 2016-02-26 ENCOUNTER — Ambulatory Visit (INDEPENDENT_AMBULATORY_CARE_PROVIDER_SITE_OTHER): Payer: Managed Care, Other (non HMO) | Admitting: Obstetrics and Gynecology

## 2016-02-26 ENCOUNTER — Encounter: Payer: Self-pay | Admitting: Obstetrics and Gynecology

## 2016-02-26 VITALS — BP 122/82 | HR 60 | Resp 16 | Ht 69.5 in | Wt 161.8 lb

## 2016-02-26 DIAGNOSIS — Z01419 Encounter for gynecological examination (general) (routine) without abnormal findings: Secondary | ICD-10-CM | POA: Diagnosis not present

## 2016-02-26 DIAGNOSIS — Z119 Encounter for screening for infectious and parasitic diseases, unspecified: Secondary | ICD-10-CM | POA: Diagnosis not present

## 2016-02-26 DIAGNOSIS — Z0000 Encounter for general adult medical examination without abnormal findings: Secondary | ICD-10-CM | POA: Diagnosis not present

## 2016-02-26 LAB — POCT URINALYSIS DIPSTICK
BILIRUBIN UA: NEGATIVE
Glucose, UA: NEGATIVE
KETONES UA: NEGATIVE
LEUKOCYTES UA: NEGATIVE
Nitrite, UA: NEGATIVE
PH UA: 6
PROTEIN UA: NEGATIVE
RBC UA: NEGATIVE
Urobilinogen, UA: NEGATIVE

## 2016-02-26 NOTE — Patient Instructions (Signed)

## 2016-02-27 LAB — HEPATITIS C ANTIBODY: HCV AB: NEGATIVE

## 2016-02-28 ENCOUNTER — Ambulatory Visit
Admission: RE | Admit: 2016-02-28 | Discharge: 2016-02-28 | Disposition: A | Discharge status: Home / Self Care | Payer: 59 | Source: Ambulatory Visit | Attending: Obstetrics and Gynecology | Admitting: Obstetrics and Gynecology

## 2016-02-28 DIAGNOSIS — Z1231 Encounter for screening mammogram for malignant neoplasm of breast: Secondary | ICD-10-CM

## 2017-02-01 ENCOUNTER — Other Ambulatory Visit: Payer: Self-pay | Admitting: Obstetrics and Gynecology

## 2017-02-01 DIAGNOSIS — Z1231 Encounter for screening mammogram for malignant neoplasm of breast: Secondary | ICD-10-CM

## 2017-02-10 ENCOUNTER — Encounter: Payer: Self-pay | Admitting: Internal Medicine

## 2017-03-04 ENCOUNTER — Ambulatory Visit
Admission: RE | Admit: 2017-03-04 | Discharge: 2017-03-04 | Disposition: A | Discharge status: Home / Self Care | Payer: 59 | Source: Ambulatory Visit | Attending: Obstetrics and Gynecology | Admitting: Obstetrics and Gynecology

## 2017-03-04 DIAGNOSIS — Z1231 Encounter for screening mammogram for malignant neoplasm of breast: Secondary | ICD-10-CM

## 2017-03-10 DIAGNOSIS — Z0289 Encounter for other administrative examinations: Secondary | ICD-10-CM

## 2017-03-24 ENCOUNTER — Encounter: Payer: Self-pay | Admitting: Certified Nurse Midwife

## 2017-03-24 ENCOUNTER — Other Ambulatory Visit (HOSPITAL_COMMUNITY)
Admission: RE | Admit: 2017-03-24 | Discharge: 2017-03-24 | Disposition: A | Discharge status: Home / Self Care | Payer: 59 | Source: Ambulatory Visit | Attending: Certified Nurse Midwife | Admitting: Certified Nurse Midwife

## 2017-03-24 ENCOUNTER — Ambulatory Visit (INDEPENDENT_AMBULATORY_CARE_PROVIDER_SITE_OTHER): Payer: Managed Care, Other (non HMO) | Admitting: Certified Nurse Midwife

## 2017-03-24 ENCOUNTER — Ambulatory Visit: Payer: Managed Care, Other (non HMO) | Admitting: Obstetrics and Gynecology

## 2017-03-24 ENCOUNTER — Other Ambulatory Visit: Payer: Self-pay

## 2017-03-24 VITALS — BP 110/70 | HR 70 | Resp 16 | Ht 69.25 in | Wt 155.0 lb

## 2017-03-24 DIAGNOSIS — Z0000 Encounter for general adult medical examination without abnormal findings: Secondary | ICD-10-CM | POA: Diagnosis not present

## 2017-03-24 DIAGNOSIS — N951 Menopausal and female climacteric states: Secondary | ICD-10-CM | POA: Diagnosis not present

## 2017-03-24 DIAGNOSIS — Z124 Encounter for screening for malignant neoplasm of cervix: Secondary | ICD-10-CM

## 2017-03-24 DIAGNOSIS — Z01419 Encounter for gynecological examination (general) (routine) without abnormal findings: Secondary | ICD-10-CM

## 2017-03-24 NOTE — Progress Notes (Signed)
62 y.o. [id number] Married  Caucasian Fe here for annual exam. Menopausal no HRT.Denies vaginal bleeding or vaginal dryness. Sees Urgent care if needed. Would like to do Cologard if insurance coverage. Needs fasting labs today. Some dry skin issues only, otherwise no other health issues. Building new house for the first time his year and very busy with project!  Patient's last menstrual period was 03/09/2004.          Sexually active: Yes.    The current method of family planning is tubal ligation.    Exercising: Yes.    walking, physical work Smoker:  no  Health Maintenance: Pap:  01-09-15 neg HPV HR neg History of Abnormal Pap: yes, History of CIN 2 with LEEP 1994 MMG:  03-04-17 category c density birads 1:neg Self Breast exams: yes Colonoscopy:  2008  BMD:   2004 TDaP: 2015 Shingles: no Pneumonia: no Hep C and HIV: Hep c neg 2017 Labs: yes   reports that  has never smoked. she has never used smokeless tobacco. She reports that she does not drink alcohol or use drugs.  Past Medical History:  Diagnosis Date  . CIN II (cervical intraepithelial neoplasia II) 1994    Past Surgical History:  Procedure Laterality Date  . AUGMENTATION MAMMAPLASTY  2002   -saline  . CERVICAL BIOPSY  W/ LOOP ELECTRODE EXCISION  1994   CIN II  . CESAREAN SECTION     x2  . HYSTEROSCOPY  2000   resection of polyp  . KNEE ARTHROSCOPY Right 2006  . TUBAL LIGATION      Current Outpatient Medications  Medication Sig Dispense Refill  . calcium carbonate (OS-CAL) 600 MG TABS tablet Take 600 mg by mouth 2 (two) times daily with a meal.    . Multiple Vitamin (MULTIVITAMIN) capsule Take 1 capsule by mouth daily.     No current facility-administered medications for this visit.     Family History  Problem Relation Age of Onset  . Osteoporosis Mother   . Hypertension Mother   . Kidney cancer Father        deceased  . Heart attack Brother 50  . Hypertension Brother   . Hypertension Brother   .  Hypertension Maternal Grandmother     ROS:  Pertinent items are noted in HPI.  Otherwise, a comprehensive ROS was negative.  Exam:   BP 110/70   Pulse 70   Resp 16   Ht 5' 9.25" (1.759 m)   Wt 155 lb (70.3 kg)   LMP 03/09/2004   BMI 22.72 kg/m  Height: 5' 9.25" (175.9 cm) Ht Readings from Last 3 Encounters:  03/24/17 5' 9.25" (1.759 m)  02/26/16 5' 9.5" (1.765 m)  01/09/15 5\' 9"  (1.753 m)    General appearance: alert, cooperative and appears stated age Head: Normocephalic, without obvious abnormality, atraumatic Neck: no adenopathy, supple, symmetrical, trachea midline and thyroid normal to inspection and palpation Lungs: clear to auscultation bilaterally Breasts: normal appearance, no masses or tenderness, No nipple retraction or dimpling, No nipple discharge or bleeding, No axillary or supraclavicular adenopathy Heart: regular rate and rhythm Abdomen: soft, non-tender; no masses,  no organomegaly Extremities: extremities normal, atraumatic, no cyanosis or edema Skin: Skin color, texture, turgor normal. No rashes or lesions Lymph nodes: Cervical, supraclavicular, and axillary nodes normal. No abnormal inguinal nodes palpated Neurologic: Grossly normal   Pelvic: External genitalia:  no lesions              Urethra:  normal appearing urethra  with no masses, tenderness or lesions              Bartholin's and Skene's: normal                 Vagina: normal appearing vagina with normal color and discharge, no lesions              Cervix: no cervical motion tenderness, no lesions and normal appearance              Pap taken: Yes.   Bimanual Exam:  Uterus:  normal size, contour, position, consistency, mobility, non-tender              Adnexa: normal adnexa and no mass, fullness, tenderness               Rectovaginal: Confirms               Anus:  normal sphincter tone, no lesions  Chaperone present: yes  A:  Well Woman with normal exam  Menopausal no HRT  Vaginal  dryness  Colonoscopy due  BMD due  Screening labs  P:   Reviewed health and wellness pertinent to exam  Aware of need to evaluate if vaginal bleeding  Discussed option for use for dryness, prefers no medication. Discussed OTC options. Given instructions for coconut oil use  Discussed risks/benefits of colonoscopy, declines scheduling would like to do Cologard due to no family history of concerns. Patient will check with her insurance and advise if decides to proceed.Questions addressed.  Discussed BMD due, patient will schedule and call if needs assistance with.  Labs:CBC, CMP, Lipid panel, TSH, Vitamin D  Pap smear: yes   counseled on breast self exam, mammography screening, feminine hygiene, adequate intake of calcium and vitamin D, diet and exercise  return annually or prn  An After Visit Summary was printed and given to the patient.

## 2017-03-25 ENCOUNTER — Telehealth: Payer: Self-pay | Admitting: Certified Nurse Midwife

## 2017-03-25 ENCOUNTER — Other Ambulatory Visit: Payer: Self-pay | Admitting: Certified Nurse Midwife

## 2017-03-25 DIAGNOSIS — R899 Unspecified abnormal finding in specimens from other organs, systems and tissues: Secondary | ICD-10-CM

## 2017-03-25 DIAGNOSIS — Z780 Asymptomatic menopausal state: Secondary | ICD-10-CM

## 2017-03-25 LAB — LIPID PANEL
CHOL/HDL RATIO: 3.4 ratio (ref 0.0–4.4)
Cholesterol, Total: 189 mg/dL (ref 100–199)
HDL: 55 mg/dL (ref >39)
LDL Calculated: 121 mg/dL — ABNORMAL HIGH (ref 0–99)
Triglycerides: 67 mg/dL (ref 0–149)
VLDL Cholesterol Cal: 13 mg/dL (ref 5–40)

## 2017-03-25 LAB — COMPREHENSIVE METABOLIC PANEL
ALK PHOS: 71 IU/L (ref 39–117)
ALT: 16 IU/L (ref 0–32)
AST: 14 IU/L (ref 0–40)
Albumin/Globulin Ratio: 1.7 (ref 1.2–2.2)
Albumin: 4.5 g/dL (ref 3.6–4.8)
BUN/Creatinine Ratio: 30 — ABNORMAL HIGH (ref 12–28)
BUN: 16 mg/dL (ref 8–27)
Bilirubin Total: 0.2 mg/dL (ref 0.0–1.2)
CO2: 25 mmol/L (ref 20–29)
CREATININE: 0.53 mg/dL — AB (ref 0.57–1.00)
Calcium: 9.3 mg/dL (ref 8.7–10.3)
Chloride: 103 mmol/L (ref 96–106)
GFR calc Af Amer: 119 mL/min/{1.73_m2} (ref >59)
GFR calc non Af Amer: 104 mL/min/{1.73_m2} (ref >59)
GLUCOSE: 79 mg/dL (ref 65–99)
Globulin, Total: 2.7 g/dL (ref 1.5–4.5)
Potassium: 4.3 mmol/L (ref 3.5–5.2)
Sodium: 144 mmol/L (ref 134–144)
Total Protein: 7.2 g/dL (ref 6.0–8.5)

## 2017-03-25 LAB — TSH: TSH: 2.43 u[IU]/mL (ref 0.450–4.500)

## 2017-03-25 LAB — CYTOLOGY - PAP
Adequacy: ABSENT
Diagnosis: NEGATIVE
HPV (WINDOPATH): NOT DETECTED

## 2017-03-25 LAB — CBC
HEMATOCRIT: 39.1 % (ref 34.0–46.6)
HEMOGLOBIN: 13.1 g/dL (ref 11.1–15.9)
MCH: 31.6 pg (ref 26.6–33.0)
MCHC: 33.5 g/dL (ref 31.5–35.7)
MCV: 94 fL (ref 79–97)
Platelets: 248 10*3/uL (ref 150–379)
RBC: 4.15 x10E6/uL (ref 3.77–5.28)
RDW: 13 % (ref 12.3–15.4)
WBC: 5.6 10*3/uL (ref 3.4–10.8)

## 2017-03-25 LAB — VITAMIN D 25 HYDROXY (VIT D DEFICIENCY, FRACTURES): Vit D, 25-Hydroxy: 31.3 ng/mL (ref 30.0–100.0)

## 2017-03-25 NOTE — Telephone Encounter (Signed)
Order for BMD placed in Epic to the [facility]. Patient has been notified and is agreeable. Will schedule appointment.  Routing to provider for final review. Patient agreeable to disposition. Will close encounter.

## 2017-03-25 NOTE — Telephone Encounter (Signed)
Patient said she is returning a call to [facility] regarding her Cologuard. No open phone note?

## 2017-03-25 NOTE — Telephone Encounter (Signed)
Patient called and said the [facility] requires an order before she can schedule her first bone density test. Routing to triage for assistance.

## 2017-03-25 NOTE — Telephone Encounter (Signed)
Spoke with patient. Patient is asking for where Cologuard will be process and the diagnosis that will be used to check with her insurance regarding coverage.  Advised this is performed through [employer]. ICD 10 codes are Z12.11 and Z12.12. NPI for [employer] is [id number] TIN for [employer] is [id number]. Patient will call and check coverage.  Routing to provider for final review. Patient agreeable to disposition. Will close encounter.

## 2017-03-26 ENCOUNTER — Telehealth: Payer: Self-pay | Admitting: *Deleted

## 2017-03-26 NOTE — Telephone Encounter (Signed)
Message left to return call to Sabrina Harper at [phone].    

## 2017-03-26 NOTE — Telephone Encounter (Signed)
Notes recorded by Regina Eck, CNM on 03/25/2017 at 12:19 PM EST Notify patient that her lipid panel is normal Cholesterol Is 189 Triglycerides 67, HDL 55 LDL is borderline high work on good colorful vegetables and fruits daily with lean protein and regular exercise. Recheck with aex TSH is normal Vitamin D is normal at 31.3,but recommend 1000 IU Vitamin D3 Daily for bone support CBC is normal Liver, kidney, glucose profile is normal with slightly elevated BUN/Creatine clearance and Creatinine low Recheck in 2 weeks order in please schedule

## 2017-03-26 NOTE — Telephone Encounter (Signed)
Notes recorded by Regina Eck, CNM on 03/25/2017 at 5:18 PM EST Pap smear reviewed negative HPVHR not detected. No endos noted 02 ------

## 2017-03-26 NOTE — Telephone Encounter (Signed)
Patient returned call. All results reviewed with patient as seen below from Melvia Heaps, CNM. Patient verbalized understanding. Patient has aex scheduled in 03/2018. 2 week lab recheck scheduled for Thursday 04/08/17 at 0900. Patient agreeable to date and time of appointment. States she takes ibuprofen pm every night, and wondering if that is cause of abnormal blood work. States she won't take it for 2 weeks to see. Future orders present for lab work.   Patient agreeable to disposition. Will close encounter.

## 2017-04-08 ENCOUNTER — Other Ambulatory Visit (INDEPENDENT_AMBULATORY_CARE_PROVIDER_SITE_OTHER): Payer: Managed Care, Other (non HMO)

## 2017-04-08 ENCOUNTER — Other Ambulatory Visit: Payer: Self-pay | Admitting: Certified Nurse Midwife

## 2017-04-08 ENCOUNTER — Telehealth: Payer: Self-pay | Admitting: Certified Nurse Midwife

## 2017-04-08 DIAGNOSIS — R899 Unspecified abnormal finding in specimens from other organs, systems and tissues: Secondary | ICD-10-CM

## 2017-04-08 NOTE — Telephone Encounter (Signed)
Cologuard order requisition form to Melvia Heaps, CNM desk to review and sign.   Routing to provider for review.

## 2017-04-08 NOTE — Telephone Encounter (Signed)
Detailed message left per DPR advising patient cologuard order had been faxed. Advised to return call with any additional questions.   Encounter closed.

## 2017-04-08 NOTE — Telephone Encounter (Signed)
Forwarding to Karmen Bongo, RN.

## 2017-04-08 NOTE — Telephone Encounter (Signed)
Patient just spoke with cologuard and was informed that it would be covered. Patient would like to proceed with ordering.

## 2017-04-08 NOTE — Telephone Encounter (Signed)
Cologuard order faxed to [employer]. Fax #: [phone].

## 2017-04-09 LAB — BUN+CREAT
BUN/Creatinine Ratio: 33 — ABNORMAL HIGH (ref 12–28)
BUN: 19 mg/dL (ref 8–27)
CREATININE: 0.58 mg/dL (ref 0.57–1.00)
GFR, EST AFRICAN AMERICAN: 116 mL/min/{1.73_m2} (ref >59)
GFR, EST NON AFRICAN AMERICAN: 101 mL/min/{1.73_m2} (ref >59)

## 2017-04-13 ENCOUNTER — Telehealth: Payer: Self-pay

## 2017-04-13 NOTE — Telephone Encounter (Signed)
Spoke with patient. Advised of results as seen below. Verbalizes understanding. Will establish care with PCP. Encounter closed.  Notes recorded by Regina Eck, CNM on 04/13/2017 at 8:38 AM EST Notify patient Creatinine was normal,BUN was normal, ratio was slightly elevated at 33, normal is 12-28. Make sure she is getting adequate water and balanced diet. Sodium was normal on CMP evaluation at aex. No further screening at this time. She needs to try to establish with PCP as we discussed in the next year in addition to seeing [place].

## 2017-04-27 ENCOUNTER — Other Ambulatory Visit: Payer: 59

## 2017-04-29 LAB — COLOGUARD: COLOGUARD: NEGATIVE

## 2017-04-30 ENCOUNTER — Other Ambulatory Visit: Payer: Self-pay | Admitting: *Deleted

## 2017-04-30 ENCOUNTER — Ambulatory Visit
Admission: RE | Admit: 2017-04-30 | Discharge: 2017-04-30 | Disposition: A | Discharge status: Home / Self Care | Payer: Managed Care, Other (non HMO) | Source: Ambulatory Visit | Attending: Certified Nurse Midwife | Admitting: Certified Nurse Midwife

## 2017-04-30 ENCOUNTER — Telehealth: Payer: Self-pay | Admitting: *Deleted

## 2017-04-30 DIAGNOSIS — Z780 Asymptomatic menopausal state: Secondary | ICD-10-CM

## 2017-04-30 DIAGNOSIS — Z1211 Encounter for screening for malignant neoplasm of colon: Secondary | ICD-10-CM

## 2017-04-30 NOTE — Telephone Encounter (Signed)
Call to patient. Negative cologuard results reviewed with patient and she verbalized understanding.   Patient agreeable to disposition. Will close encounter.

## 2017-05-05 ENCOUNTER — Ambulatory Visit: Payer: Managed Care, Other (non HMO) | Admitting: Certified Nurse Midwife

## 2017-05-05 ENCOUNTER — Other Ambulatory Visit: Payer: Self-pay

## 2017-05-05 ENCOUNTER — Encounter: Payer: Self-pay | Admitting: Certified Nurse Midwife

## 2017-05-05 VITALS — BP 100/60 | HR 72 | Temp 98.2°F | Resp 16 | Ht 69.25 in | Wt 150.0 lb

## 2017-05-05 DIAGNOSIS — N390 Urinary tract infection, site not specified: Secondary | ICD-10-CM | POA: Diagnosis not present

## 2017-05-05 DIAGNOSIS — R319 Hematuria, unspecified: Secondary | ICD-10-CM

## 2017-05-05 DIAGNOSIS — N951 Menopausal and female climacteric states: Secondary | ICD-10-CM

## 2017-05-05 LAB — POCT URINALYSIS DIPSTICK
BILIRUBIN UA: NEGATIVE
GLUCOSE UA: NEGATIVE
KETONES UA: NEGATIVE
Nitrite, UA: POSITIVE
PH UA: 5 (ref 5.0–8.0)
UROBILINOGEN UA: NEGATIVE U/dL — AB

## 2017-05-05 MED ORDER — CIPROFLOXACIN HCL 500 MG PO TABS: 500.0000 mg | ORAL_TABLET | Freq: Two times a day (BID) | ORAL | 0 refills | Status: DC

## 2017-05-05 NOTE — Progress Notes (Signed)
62 y.o. Married Caucasian female [phone] here with complaint of UTI, with onset  on 24 hours ago. Patient complaining of urinary frequency/ and pain with urination. Patient denies fever, chills, nausea or back pain. No new personal products. Patient feels not related to sexual activity. Denies any vaginal symptoms.     Menopausal with vaginal dryness. Patient not consuming  adequate water intake. Patient did see slight pink in urine and has not seen any since. No other issues today.  ROS Pertinent to HPI  O: Healthy female WDWN Affect: Normal, orientation x 3 Skin : warm and dry CVAT: negative bilateral Abdomen: positive for suprapubic tenderness  Pelvic exam: External genital area: normal, no lesions Bladder,Urethra tender, Urethral meatus: tender, red Vagina: normal vaginal discharge, normal appearance  No blood present Cervix: normal, non tender Uterus:normal,non tender Adnexa: normal non tender, no fullness or masses   A: UTI Normal pelvic exam poct urine-wbc 2+, rbc 2+, protein 2+, nitrite-positive Vaginal dryness  P: Reviewed findings of UTI and need for treatment. XB:WIOMB see order with instructions TDH:RCBUL micro, culture Reviewed warning signs and symptoms of UTI and need to advise if occurring. Encouraged to limit soda, tea, and coffee and be sure to increase water intake. Discussed importance of resting to allow body to work with antibiotics and resolve UTI. Questions addressed. Discussed vaginal dryness can also increase risk of UTI and to start using moisture in vaginal area again.   RV prn

## 2017-05-05 NOTE — Patient Instructions (Signed)

## 2017-05-06 LAB — URINALYSIS, MICROSCOPIC ONLY
Casts: NONE SEEN /lpf
RBC, UA: >30 /hpf — AB (ref 0–?)

## 2017-05-07 LAB — URINE CULTURE

## 2017-05-10 ENCOUNTER — Encounter: Payer: Self-pay | Admitting: Certified Nurse Midwife

## 2017-08-09 ENCOUNTER — Telehealth: Payer: Self-pay | Admitting: Certified Nurse Midwife

## 2017-08-09 NOTE — Telephone Encounter (Signed)
The patient has a "spot on her breast" that she would like "looked at" tomorrow if possible.

## 2017-08-09 NOTE — Telephone Encounter (Signed)
Spoke with patient. Patient has a red circular rash around her nipple that is the size of a dime. Denies being outside around any poison ivy, change to soaps or detergents. No lumps or warmth to the breast. Appointment scheduled for tomorrow 08/10/2017 at 10 am with Melvia Heaps CNM. Patient is agreeable to date and time.  Routing to provider for final review. Patient agreeable to disposition. Will close encounter.

## 2017-08-10 ENCOUNTER — Other Ambulatory Visit: Payer: Self-pay

## 2017-08-10 ENCOUNTER — Ambulatory Visit (INDEPENDENT_AMBULATORY_CARE_PROVIDER_SITE_OTHER): Payer: Managed Care, Other (non HMO) | Admitting: Certified Nurse Midwife

## 2017-08-10 ENCOUNTER — Encounter: Payer: Self-pay | Admitting: Certified Nurse Midwife

## 2017-08-10 VITALS — BP 110/64 | HR 72 | Resp 16 | Ht 69.25 in | Wt 155.0 lb

## 2017-08-10 DIAGNOSIS — R234 Changes in skin texture: Secondary | ICD-10-CM

## 2017-08-10 DIAGNOSIS — Z0000 Encounter for general adult medical examination without abnormal findings: Secondary | ICD-10-CM | POA: Diagnosis not present

## 2017-08-10 DIAGNOSIS — B372 Candidiasis of skin and nail: Secondary | ICD-10-CM | POA: Diagnosis not present

## 2017-08-10 MED ORDER — NYSTATIN-TRIAMCINOLONE 100000-0.1 UNIT/GM-% EX OINT: 1.0000 "application " | TOPICAL_OINTMENT | Freq: Two times a day (BID) | CUTANEOUS | 0 refills | Status: DC

## 2017-08-10 NOTE — Progress Notes (Signed)
   Subjective:   62 y.o. Married Caucasian female presents for evaluation of right breast mass. Onset of the symptoms was 3 days. Patient sought evaluation because of rash.  Contributing factors include family hx on mother's, father's side. (maternal great aunt( 78's) and Paternal aunt (54's).Patient concerned because spouse niece had inflammatory breast and disease and died at early age.Patient denies history of trauma, bites, or injuries, that she is aware. 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 No itching that she is aware of or exudate or drainage. No but bite that she is aware of , but mosquitos are everywhere. Denies any new detergent, bra or soap.. Last mammogram was 5 months ago and normal. No other health issues.   Review of Systems Pertinent items are noted in HPI.   Objective:   General appearance: alert, cooperative and no distress  Breasts: normal appearance, no masses or tenderness, No nipple retraction or dimpling, No nipple discharge or bleeding, No axillary or supraclavicular adenopathy, very small area of pink, circular about dime size with slight scaling noted at 7 o'clock, no exudate or drainage, non tender, with magnifying glass ? bite induration, wet prep of area   KOH,Saline wet prep from breast: positive for yeast strands only   Assessment:   ASSESSMENT:Patient is diagnosed with Yeast dermatitis ? Insect bite also Normal breast exam except for above   Plan:   PLAN: Findings were discussed with patient and treatment with Mycolog ointment to area. Rx Mycolog ointment bid x 3-5 days see  Order with instructions If  No improvement or area increases or pain ensues, need to call to be seen in the next 2-3 days. Patient  Agreeable. Encouraged to change bras daily and air breast at night. Questions addressed.    Rv prn

## 2017-11-19 ENCOUNTER — Emergency Department (HOSPITAL_COMMUNITY)
Admission: EM | Admit: 2017-11-19 | Discharge: 2017-11-19 | Disposition: A | Discharge status: Home / Self Care | Payer: Managed Care, Other (non HMO) | Attending: Emergency Medicine | Admitting: Emergency Medicine

## 2017-11-19 ENCOUNTER — Other Ambulatory Visit: Payer: Self-pay

## 2017-11-19 ENCOUNTER — Encounter (HOSPITAL_COMMUNITY): Payer: Self-pay

## 2017-11-19 DIAGNOSIS — T7840XA Allergy, unspecified, initial encounter: Secondary | ICD-10-CM | POA: Diagnosis not present

## 2017-11-19 DIAGNOSIS — L509 Urticaria, unspecified: Secondary | ICD-10-CM | POA: Diagnosis present

## 2017-11-19 DIAGNOSIS — Z79899 Other long term (current) drug therapy: Secondary | ICD-10-CM | POA: Insufficient documentation

## 2017-11-19 MED ORDER — EPINEPHRINE 0.15 MG/0.3ML IJ SOAJ: 0.1500 mg | INTRAMUSCULAR | 0 refills | Status: DC | PRN

## 2017-11-19 MED ORDER — FAMOTIDINE IN NACL 20-0.9 MG/50ML-% IV SOLN
20.0000 mg | Freq: Once | INTRAVENOUS | Status: AC
  Administered 2017-11-19: 20 mg via INTRAVENOUS
  Filled 2017-11-19: qty 50

## 2017-11-19 MED ORDER — METHYLPREDNISOLONE SODIUM SUCC 125 MG IJ SOLR
125.0000 mg | Freq: Once | INTRAMUSCULAR | Status: AC
  Administered 2017-11-19: 125 mg via INTRAVENOUS
  Filled 2017-11-19: qty 2

## 2017-11-19 MED ORDER — PREDNISONE 10 MG (21) PO TBPK: ORAL_TABLET | ORAL | 0 refills | Status: DC

## 2017-11-19 NOTE — ED Triage Notes (Signed)
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  Pt took benadryl before UC.  Pt was having c/o of tongue swelling so UC called EMS.  Hives under both armpits.  Pt put cortisone cream on hives to relieve itching.

## 2017-11-19 NOTE — ED Notes (Signed)
Bed: WA20 Expected date:  Expected time:  Means of arrival:  Comments: Allergic reaction

## 2017-11-19 NOTE — ED Provider Notes (Signed)
[place] DEPT Provider Note   CSN: [id number] Arrival date & time: 11/19/17  1253     History   Chief Complaint Chief Complaint  Patient presents with  . Allergic Reaction    HPI Sabrina Harper is a 62 y.o. female.  Pt presents to the ED today with an allergic reaction.  The pt was exposed with wheat grass and developed hives under arm and tongue swelling.  She took 50 mg benadryl, then drove to urgent care.  Urgent care called EMS due to the tongue swelling.  EMS did not give any meds en route.  Pt said tongue swelling is much better and hives have improved.     Past Medical History:  Diagnosis Date  . CIN II (cervical intraepithelial neoplasia II) 1994    There are no active problems to display for this patient.   Past Surgical History:  Procedure Laterality Date  . AUGMENTATION MAMMAPLASTY  2002   -saline  . CERVICAL BIOPSY  W/ LOOP ELECTRODE EXCISION  1994   CIN II  . CESAREAN SECTION     x2  . HYSTEROSCOPY  2000   resection of polyp  . KNEE ARTHROSCOPY Right 2006  . TUBAL LIGATION       OB History    Gravida  3   Para  3   Term  3   Preterm      AB      Living  3     SAB      TAB      Ectopic      Multiple      Live Births               Home Medications    Prior to Admission medications   Medication Sig Start Date End Date Taking? Authorizing Provider  calcium carbonate (OS-CAL) 600 MG TABS tablet Take 600 mg by mouth 2 (two) times daily with a meal.   Yes [provider]  diphenhydrAMINE (BENADRYL) 25 MG tablet Take 50 mg by mouth every 6 (six) hours as needed for allergies.   Yes [provider]  ibuprofen (ADVIL,MOTRIN) 200 MG tablet Take 600 mg by mouth every 6 (six) hours as needed for mild pain or moderate pain.   Yes [provider]  Multiple Vitamin (MULTIVITAMIN) capsule Take 1 capsule by mouth daily.   Yes [provider]    Pseudoeph-Doxylamine-DM-APAP (NYQUIL PO) Take 1 tablet by mouth at bedtime as needed (cold/flu).   Yes [provider]  EPINEPHrine (EPIPEN JR) 0.15 MG/0.3ML injection Inject 0.3 mLs (0.15 mg total) into the muscle as needed for anaphylaxis. 11/19/17   Isla Pence, MD  nystatin-triamcinolone ointment [facility]) Apply 1 application topically 2 (two) times daily. Patient not taking: Reported on 11/19/2017 08/10/17   Regina Eck, CNM  predniSONE (STERAPRED UNI-PAK 21 TAB) 10 MG (21) TBPK tablet Take 6 tabs daily  for 2 days, then 5 for 2 days, then 4 for 2 days, then 3 for 2 days, 2 for 2 days, then 1 tab for 2 days 11/19/17   Isla Pence, MD    Family History Family History  Problem Relation Age of Onset  . Osteoporosis Mother   . Hypertension Mother   . Kidney cancer Father        deceased  . Heart attack Brother 62  . Hypertension Brother   . Hypertension Brother   . Hypertension Maternal Grandmother     Social History  Social History   Tobacco Use  . Smoking status: Never Smoker  . Smokeless tobacco: Never Used  Substance Use Topics  . Alcohol use: No    Frequency: Never  . Drug use: No     Allergies   Black cohosh and Macrobid [nitrofurantoin]   Review of Systems Review of Systems  HENT:       Tongue swelling  Skin: Positive for rash.  All other systems reviewed and are negative.    Physical Exam Updated Vital Signs BP 130/84 (BP Location: Right Arm)   Pulse 75   Temp 98.6 F (37 C) (Oral)   Resp 18   Ht 5\' 9"  (1.753 m)   Wt 73.5 kg   LMP 03/09/2004   SpO2 98%   BMI 23.92 kg/m   Physical Exam  Constitutional: She is oriented to person, place, and time. She appears well-developed and well-nourished.  HENT:  Head: Normocephalic and atraumatic.  Right Ear: External ear normal.  Left Ear: External ear normal.  Nose: Nose normal.  Mouth/Throat: Oropharynx is clear and moist.  Eyes: Pupils are equal, round, and reactive to light.  Conjunctivae and EOM are normal.  Neck: Normal range of motion. Neck supple.  Cardiovascular: Normal rate, regular rhythm, normal heart sounds and intact distal pulses.  Pulmonary/Chest: Effort normal and breath sounds normal.  Abdominal: Soft. Bowel sounds are normal.  Musculoskeletal: Normal range of motion.  Neurological: She is alert and oriented to person, place, and time.  Skin: Skin is warm. Capillary refill takes less than 2 seconds.  Psychiatric: She has a normal mood and affect. Her behavior is normal. Judgment and thought content normal.  Nursing note and vitals reviewed.    ED Treatments / Results  Labs (all labs ordered are listed, but only abnormal results are displayed) Labs Reviewed - No data to display  EKG None  Radiology No results found.  Procedures Procedures (including critical care time)  Medications Ordered in ED Medications  methylPREDNISolone sodium succinate (SOLU-MEDROL) 125 mg/2 mL injection 125 mg (125 mg Intravenous Given 11/19/17 1320)  famotidine (PEPCID) IVPB 20 mg premix (20 mg Intravenous New Bag/Given 11/19/17 1327)     Initial Impression / Assessment and Plan / ED Course  I have reviewed the triage vital signs and the nursing notes.  Pertinent labs & imaging results that were available during my care of the patient were reviewed by me and considered in my medical decision making (see chart for details).    Pt feels better.  She is back to normal and is anxious to leave.  She does have family who will be with her today.  She knows to return if worse.  Final Clinical Impressions(s) / ED Diagnoses   Final diagnoses:  Allergic reaction, initial encounter    ED Discharge Orders         Ordered    predniSONE (STERAPRED UNI-PAK 21 TAB) 10 MG (21) TBPK tablet     11/19/17 1421    EPINEPHrine (EPIPEN JR) 0.15 MG/0.3ML injection  As needed     11/19/17 1421           Isla Pence, MD 11/19/17 1423

## 2018-01-20 ENCOUNTER — Other Ambulatory Visit: Payer: Self-pay | Admitting: Obstetrics and Gynecology

## 2018-01-20 ENCOUNTER — Other Ambulatory Visit: Payer: Self-pay | Admitting: Certified Nurse Midwife

## 2018-01-20 DIAGNOSIS — R928 Other abnormal and inconclusive findings on diagnostic imaging of breast: Secondary | ICD-10-CM

## 2018-03-07 ENCOUNTER — Ambulatory Visit
Admission: RE | Admit: 2018-03-07 | Discharge: 2018-03-07 | Disposition: A | Discharge status: Home / Self Care | Payer: Managed Care, Other (non HMO) | Source: Ambulatory Visit | Attending: Certified Nurse Midwife | Admitting: Certified Nurse Midwife

## 2018-03-07 DIAGNOSIS — R928 Other abnormal and inconclusive findings on diagnostic imaging of breast: Secondary | ICD-10-CM

## 2018-03-31 ENCOUNTER — Other Ambulatory Visit: Payer: Self-pay

## 2018-03-31 ENCOUNTER — Encounter: Payer: Self-pay | Admitting: Certified Nurse Midwife

## 2018-03-31 ENCOUNTER — Ambulatory Visit (INDEPENDENT_AMBULATORY_CARE_PROVIDER_SITE_OTHER): Payer: Managed Care, Other (non HMO) | Admitting: Certified Nurse Midwife

## 2018-03-31 VITALS — BP 108/70 | HR 68 | Resp 16 | Ht 68.75 in | Wt 165.0 lb

## 2018-03-31 DIAGNOSIS — Z780 Asymptomatic menopausal state: Secondary | ICD-10-CM | POA: Diagnosis not present

## 2018-03-31 DIAGNOSIS — Z01419 Encounter for gynecological examination (general) (routine) without abnormal findings: Secondary | ICD-10-CM

## 2018-03-31 NOTE — Progress Notes (Signed)
63 y.o. G66P3003 Married  Caucasian Fe here for annual exam. Menopausal no HRT. Denies vaginal bleeding or vaginal dryness. Recent visit with ENT for tinnitus and just hearing loss. Has appointment with [facility] to establish care!. No health issues today. Took Shingles vaccine last year and no problem with vaccine. Has been a good year, spouse to retire soon and finished building two houses!  Patient's last menstrual period was 03/09/2004.          Sexually active: Yes.    The current method of family planning is tubal ligation.  Exercising: Yes.    walking Smoker:  no  Review of Systems  Constitutional: Negative.   HENT: Negative.   Eyes: Negative.   Respiratory: Negative.   Cardiovascular: Negative.   Gastrointestinal: Negative.   Genitourinary: Negative.   Musculoskeletal: Negative.   Skin: Negative.   Neurological: Negative.   Endo/Heme/Allergies: Negative.   Psychiatric/Behavioral: Negative.     Health Maintenance: Pap:  03-24-17 neg HPV HR neg History of Abnormal Pap: yes LEEP MMG:  03-07-18 category d density birads 1:neg Self Breast exams: yes Colonoscopy:  2008, cologard 2018 BMD:   2019 TDaP:  2015 Shingles: 2019 Pneumonia: no Hep C and HIV: Hep c neg 2017 Labs: no  With PCP   reports that she has never smoked. She has never used smokeless tobacco. She reports that she does not drink alcohol or use drugs.  Past Medical History:  Diagnosis Date  . CIN II (cervical intraepithelial neoplasia II) 1994    Past Surgical History:  Procedure Laterality Date  . AUGMENTATION MAMMAPLASTY  2002   -saline  . CERVICAL BIOPSY  W/ LOOP ELECTRODE EXCISION  1994   CIN II  . CESAREAN SECTION     x2  . HYSTEROSCOPY  2000   resection of polyp  . KNEE ARTHROSCOPY Right 2006  . TUBAL LIGATION      Current Outpatient Medications  Medication Sig Dispense Refill  . calcium carbonate (OS-CAL) 600 MG TABS tablet Take 600 mg by mouth 2 (two) times daily with a meal.    .  diphenhydrAMINE (BENADRYL) 25 MG tablet Take 50 mg by mouth every 6 (six) hours as needed for allergies.    Marland Kitchen EPINEPHrine (EPIPEN JR) 0.15 MG/0.3ML injection Inject 0.3 mLs (0.15 mg total) into the muscle as needed for anaphylaxis. 2 each 0  . ibuprofen (ADVIL,MOTRIN) 200 MG tablet Take 600 mg by mouth every 6 (six) hours as needed for mild pain or moderate pain.    . Multiple Vitamin (MULTIVITAMIN) capsule Take 1 capsule by mouth daily.    Marland Kitchen nystatin-triamcinolone ointment (MYCOLOG) Apply 1 application topically 2 (two) times daily. (Patient not taking: Reported on 11/19/2017) 30 g 0  . predniSONE (STERAPRED UNI-PAK 21 TAB) 10 MG (21) TBPK tablet Take 6 tabs daily  for 2 days, then 5 for 2 days, then 4 for 2 days, then 3 for 2 days, 2 for 2 days, then 1 tab for 2 days 42 tablet 0  . Pseudoeph-Doxylamine-DM-APAP (NYQUIL PO) Take 1 tablet by mouth at bedtime as needed (cold/flu).     No current facility-administered medications for this visit.     Family History  Problem Relation Age of Onset  . Osteoporosis Mother   . Hypertension Mother   . Kidney cancer Father        deceased  . Heart attack Brother 57  . Hypertension Brother   . Hypertension Brother   . Hypertension Maternal Grandmother  ROS:  Pertinent items are noted in HPI.  Otherwise, a comprehensive ROS was negative.  Exam:   LMP 03/09/2004    Ht Readings from Last 3 Encounters:  11/19/17 5\' 9"  (1.753 m)  08/10/17 5' 9.25" (1.759 m)  05/05/17 5' 9.25" (1.759 m)    General appearance: alert, cooperative and appears stated age Head: Normocephalic, without obvious abnormality, atraumatic Neck: no adenopathy, supple, symmetrical, trachea midline and thyroid normal to inspection and palpation Lungs: clear to auscultation bilaterally Breasts: normal appearance, no masses or tenderness, No nipple retraction or dimpling, No nipple discharge or bleeding, No axillary or supraclavicular adenopathy, implants palpate intact Heart:  regular rate and rhythm Abdomen: soft, non-tender; no masses,  no organomegaly Extremities: extremities normal, atraumatic, no cyanosis or edema Skin: Skin color, texture, turgor normal. No rashes or lesions Lymph nodes: Cervical, supraclavicular, and axillary nodes normal. No abnormal inguinal nodes palpated Neurologic: Grossly normal   Pelvic: External genitalia:  no lesions, normal female              Urethra:  normal appearing urethra with no masses, tenderness or lesions              Bartholin's and Skene's: normal                 Vagina: normal appearing vagina with normal color and discharge, no lesions              Cervix: multiparous appearance, no cervical motion tenderness and no lesions              Pap taken: No. Bimanual Exam:  Uterus:  normal size, contour, position, consistency, mobility, non-tender and anteverted              Adnexa: normal adnexa and no mass, fullness, tenderness               Rectovaginal: Confirms               Anus:  normal sphincter tone, no lesions  Chaperone present: yes  A:  Well Woman with normal exam  Post menopausal  No HRT  History of LEEP CIN 2 1984  Cologard last year  negative  Screening labs to be done with new PCP  P:   Reviewed health and wellness pertinent to exam  Discussed importance of advising if vaginal bleeding.  Plan pap at next aex.  Keep appt. To establish care with PCP.  Pap smear: no   counseled on breast self exam, mammography screening, feminine hygiene, adequate intake of calcium and vitamin D, diet and exercise, Kegel's exercises  return annually or prn  An After Visit Summary was printed and given to the patient.

## 2018-03-31 NOTE — Patient Instructions (Signed)

## 2019-02-06 ENCOUNTER — Other Ambulatory Visit: Payer: Self-pay | Admitting: Certified Nurse Midwife

## 2019-02-06 DIAGNOSIS — Z1231 Encounter for screening mammogram for malignant neoplasm of breast: Secondary | ICD-10-CM

## 2019-03-29 ENCOUNTER — Other Ambulatory Visit: Payer: Self-pay

## 2019-03-29 ENCOUNTER — Ambulatory Visit: Payer: Managed Care, Other (non HMO)

## 2019-03-29 ENCOUNTER — Ambulatory Visit
Admission: RE | Admit: 2019-03-29 | Discharge: 2019-03-29 | Disposition: A | Discharge status: Home / Self Care | Payer: 59 | Source: Ambulatory Visit | Attending: Certified Nurse Midwife | Admitting: Certified Nurse Midwife

## 2019-03-29 DIAGNOSIS — Z1231 Encounter for screening mammogram for malignant neoplasm of breast: Secondary | ICD-10-CM

## 2019-04-02 NOTE — Progress Notes (Signed)
Mammogram reviewed D density. Will need discussion at next aex and TC evaluation

## 2019-04-03 ENCOUNTER — Other Ambulatory Visit: Payer: Self-pay

## 2019-04-04 NOTE — Progress Notes (Signed)
64 y.o. G62P3003 Married  Caucasian Fe here for annual exam. Post menopausal no HRT. Has noted decrease libido and working through this. Spouse has prostate cancer and trying to help him with decisions. Sees Roe Coombs yearly for labs, all normal per patient. Denies vaginal dryness at this point. Still having hot flashes at hs, but no insomnia. No other health issues today.  Patient's last menstrual period was 03/09/2004.          Sexually active: Yes.    The current method of family planning is tubal ligation.    Exercising: No.  exercise Smoker:  no  Review of Systems  Constitutional: Negative.   HENT: Negative.   Eyes: Negative.   Respiratory: Negative.   Cardiovascular: Negative.   Gastrointestinal: Negative.   Genitourinary: Negative.   Musculoskeletal: Negative.   Skin: Negative.   Neurological: Negative.   Endo/Heme/Allergies: Negative.   Psychiatric/Behavioral: Negative.     Health Maintenance: Pap:  03-24-17 neg HPV HR neg History of Abnormal Pap: LEEP MMG:  03-29-19 category d density birads 1:neg Self Breast exams: yes Colonoscopy:  cologard 2018 BMD:   2019 TDaP:  2015 Shingles: 2019 Pneumonia: not done Hep C and HIV: hep c neg 2017 Labs: no   reports that she has never smoked. She has never used smokeless tobacco. She reports that she does not drink alcohol or use drugs.  Past Medical History:  Diagnosis Date  . CIN II (cervical intraepithelial neoplasia II) 1994  . Ringing of ears     Past Surgical History:  Procedure Laterality Date  . AUGMENTATION MAMMAPLASTY  2002   -saline  . CERVICAL BIOPSY  W/ LOOP ELECTRODE EXCISION  1994   CIN II  . CESAREAN SECTION     x2  . HYSTEROSCOPY  2000   resection of polyp  . KNEE ARTHROSCOPY Right 2006  . TUBAL LIGATION      Current Outpatient Medications  Medication Sig Dispense Refill  . calcium carbonate (OS-CAL) 600 MG TABS tablet Take 600 mg by mouth 2 (two) times daily with a meal.    . Multiple Vitamin  (MULTIVITAMIN) capsule Take 1 capsule by mouth daily.    Marland Kitchen EPINEPHrine (EPIPEN JR) 0.15 MG/0.3ML injection Inject 0.3 mLs (0.15 mg total) into the muscle as needed for anaphylaxis. (Patient not taking: Reported on 03/31/2018) 2 each 0   No current facility-administered medications for this visit.    Family History  Problem Relation Age of Onset  . Osteoporosis Mother   . Hypertension Mother   . Kidney cancer Father        deceased  . Heart attack Brother 76  . Hypertension Brother   . Hypertension Brother   . Hypertension Maternal Grandmother     ROS:  Pertinent items are noted in HPI.  Otherwise, a comprehensive ROS was negative.  Exam:   BP 110/68   Pulse 68   Temp 98.1 F (36.7 C) (Skin)   Resp 16   Ht 5\' 9"  (1.753 m)   Wt 173 lb (78.5 kg)   LMP 03/09/2004   BMI 25.55 kg/m  Height: 5\' 9"  (175.3 cm) Ht Readings from Last 3 Encounters:  04/05/19 5\' 9"  (1.753 m)  03/31/18 5' 8.75" (1.746 m)  11/19/17 5\' 9"  (1.753 m)    General appearance: alert, cooperative and appears stated age Head: Normocephalic, without obvious abnormality, atraumatic Neck: no adenopathy, supple, symmetrical, trachea midline and thyroid normal to inspection and palpation Lungs: clear to auscultation bilaterally Breasts: normal appearance,  no masses or tenderness, No nipple retraction or dimpling, No nipple discharge or bleeding, No axillary or supraclavicular adenopathy, Implants feel intact bilateral Heart: regular rate and rhythm Abdomen: soft, non-tender; no masses,  no organomegaly Extremities: extremities normal, atraumatic, no cyanosis or edema Skin: Skin color, texture, turgor normal. No rashes or lesions Lymph nodes: Cervical, supraclavicular, and axillary nodes normal. No abnormal inguinal nodes palpated Neurologic: Grossly normal   Pelvic: External genitalia:  no lesions              Urethra:  normal appearing urethra with no masses, tenderness or lesions              Bartholin's and  Skene's: normal                 Vagina: normal appearing vagina with normal color and discharge, no lesions              Cervix: no bleeding following Pap, no cervical motion tenderness and normal appearance              Pap taken: Yes.   Bimanual Exam:  Uterus:  normal size, contour, position, consistency, mobility, non-tender and anteverted              Adnexa: normal adnexa and no mass, fullness, tenderness               Rectovaginal: Confirms               Anus:  normal sphincter tone, no lesions  Chaperone present: yes  A:  Well Woman with normal exam  Post menopausal no HRT  History of abnormal pap smear CIN 2 with LEEP  Social stress with spouse cancer  Screening labs  P:   Reviewed health and wellness pertinent to exam  Aware of need to advise if vaginal bleeding  Continue follow up with PCP as indicated  Labs: Vitamin D, TSH,Lipid panel, CMP, CBC  Pap smear: yes   counseled on breast self exam, mammography screening, feminine hygiene, adequate intake of calcium and vitamin D, diet and exercise, Kegel's exercises  return annually or prn  An After Visit Summary was printed and given to the patient.

## 2019-04-05 ENCOUNTER — Other Ambulatory Visit: Payer: Self-pay

## 2019-04-05 ENCOUNTER — Other Ambulatory Visit (HOSPITAL_COMMUNITY)
Admission: RE | Admit: 2019-04-05 | Discharge: 2019-04-05 | Disposition: A | Discharge status: Home / Self Care | Payer: 59 | Source: Ambulatory Visit | Attending: Certified Nurse Midwife | Admitting: Certified Nurse Midwife

## 2019-04-05 ENCOUNTER — Ambulatory Visit: Payer: Managed Care, Other (non HMO) | Admitting: Certified Nurse Midwife

## 2019-04-05 ENCOUNTER — Encounter: Payer: Self-pay | Admitting: Certified Nurse Midwife

## 2019-04-05 VITALS — BP 110/68 | HR 68 | Temp 98.1°F | Resp 16 | Ht 69.0 in | Wt 173.0 lb

## 2019-04-05 DIAGNOSIS — Z01419 Encounter for gynecological examination (general) (routine) without abnormal findings: Secondary | ICD-10-CM | POA: Diagnosis not present

## 2019-04-05 DIAGNOSIS — Z0000 Encounter for general adult medical examination without abnormal findings: Secondary | ICD-10-CM

## 2019-04-05 DIAGNOSIS — Z124 Encounter for screening for malignant neoplasm of cervix: Secondary | ICD-10-CM | POA: Insufficient documentation

## 2019-04-05 NOTE — Patient Instructions (Signed)

## 2019-04-06 LAB — CBC
Hematocrit: 41.4 % (ref 34.0–46.6)
Hemoglobin: 14 g/dL (ref 11.1–15.9)
MCH: 31.7 pg (ref 26.6–33.0)
MCHC: 33.8 g/dL (ref 31.5–35.7)
MCV: 94 fL (ref 79–97)
Platelets: 245 10*3/uL (ref 150–450)
RBC: 4.42 x10E6/uL (ref 3.77–5.28)
RDW: 12.4 % (ref 11.7–15.4)
WBC: 6.2 10*3/uL (ref 3.4–10.8)

## 2019-04-06 LAB — LIPID PANEL
Chol/HDL Ratio: 3.7 ratio (ref 0.0–4.4)
Cholesterol, Total: 191 mg/dL (ref 100–199)
HDL: 52 mg/dL (ref >39)
LDL Chol Calc (NIH): 121 mg/dL — ABNORMAL HIGH (ref 0–99)
Triglycerides: 100 mg/dL (ref 0–149)
VLDL Cholesterol Cal: 18 mg/dL (ref 5–40)

## 2019-04-06 LAB — COMPREHENSIVE METABOLIC PANEL
ALT: 32 IU/L (ref 0–32)
AST: 19 IU/L (ref 0–40)
Albumin/Globulin Ratio: 1.6 (ref 1.2–2.2)
Albumin: 4.5 g/dL (ref 3.8–4.8)
Alkaline Phosphatase: 106 IU/L (ref 39–117)
BUN/Creatinine Ratio: 20 (ref 12–28)
BUN: 11 mg/dL (ref 8–27)
Bilirubin Total: 0.3 mg/dL (ref 0.0–1.2)
CO2: 25 mmol/L (ref 20–29)
Calcium: 9.4 mg/dL (ref 8.7–10.3)
Chloride: 102 mmol/L (ref 96–106)
Creatinine, Ser: 0.56 mg/dL — ABNORMAL LOW (ref 0.57–1.00)
GFR calc Af Amer: 116 mL/min/{1.73_m2} (ref >59)
GFR calc non Af Amer: 100 mL/min/{1.73_m2} (ref >59)
Globulin, Total: 2.8 g/dL (ref 1.5–4.5)
Glucose: 86 mg/dL (ref 65–99)
Potassium: 4.2 mmol/L (ref 3.5–5.2)
Sodium: 143 mmol/L (ref 134–144)
Total Protein: 7.3 g/dL (ref 6.0–8.5)

## 2019-04-06 LAB — VITAMIN D 25 HYDROXY (VIT D DEFICIENCY, FRACTURES): Vit D, 25-Hydroxy: 36.1 ng/mL (ref 30.0–100.0)

## 2019-04-06 LAB — TSH: TSH: 1.52 u[IU]/mL (ref 0.450–4.500)

## 2019-04-10 LAB — CYTOLOGY - PAP
Adequacy: ABSENT
Comment: NEGATIVE
Diagnosis: NEGATIVE
High risk HPV: NEGATIVE

## 2019-04-20 ENCOUNTER — Ambulatory Visit: Payer: 59 | Attending: Internal Medicine

## 2019-04-20 DIAGNOSIS — Z20822 Contact with and (suspected) exposure to covid-19: Secondary | ICD-10-CM

## 2019-04-21 LAB — NOVEL CORONAVIRUS, NAA: SARS-CoV-2, NAA: NOT DETECTED

## 2019-05-30 ENCOUNTER — Encounter: Payer: Self-pay | Admitting: Certified Nurse Midwife

## 2020-02-13 ENCOUNTER — Other Ambulatory Visit: Payer: Self-pay | Admitting: Internal Medicine

## 2020-02-13 ENCOUNTER — Other Ambulatory Visit: Payer: Self-pay | Admitting: Physician Assistant

## 2020-02-13 DIAGNOSIS — Z1231 Encounter for screening mammogram for malignant neoplasm of breast: Secondary | ICD-10-CM

## 2020-04-02 ENCOUNTER — Ambulatory Visit
Admission: RE | Admit: 2020-04-02 | Discharge: 2020-04-02 | Disposition: A | Discharge status: Home / Self Care | Payer: Managed Care, Other (non HMO) | Source: Ambulatory Visit | Attending: Physician Assistant | Admitting: Physician Assistant

## 2020-04-02 ENCOUNTER — Other Ambulatory Visit: Payer: Self-pay

## 2020-04-02 DIAGNOSIS — Z1231 Encounter for screening mammogram for malignant neoplasm of breast: Secondary | ICD-10-CM

## 2020-04-08 ENCOUNTER — Ambulatory Visit: Payer: Managed Care, Other (non HMO) | Admitting: Certified Nurse Midwife

## 2020-04-29 ENCOUNTER — Encounter: Payer: Self-pay | Admitting: Internal Medicine

## 2020-06-24 ENCOUNTER — Other Ambulatory Visit: Payer: Self-pay

## 2020-06-24 ENCOUNTER — Ambulatory Visit (AMBULATORY_SURGERY_CENTER): Payer: Self-pay | Admitting: *Deleted

## 2020-06-24 VITALS — Ht 69.0 in | Wt 175.0 lb

## 2020-06-24 DIAGNOSIS — Z1211 Encounter for screening for malignant neoplasm of colon: Secondary | ICD-10-CM

## 2020-06-24 MED ORDER — NA SULFATE-K SULFATE-MG SULF 17.5-3.13-1.6 GM/177ML PO SOLN: ORAL | 0 refills | Status: DC

## 2020-06-24 NOTE — Progress Notes (Signed)
Patient is here in-person for PV. Patient denies any allergies to eggs or soy. Patient denies any problems with anesthesia/sedation. Patient denies any oxygen use at home. Patient denies taking any diet/weight loss medications or blood thinners. Patient is not being treated for MRSA or C-diff. Patient is aware of our care-partner policy and Covid-19 safety protocol. EMMI education assigned to the patient for the procedure, sent to MyChart.   Patient is fully COVID-19 vaccinated, per patient.    

## 2020-07-08 ENCOUNTER — Other Ambulatory Visit: Payer: Self-pay

## 2020-07-08 ENCOUNTER — Ambulatory Visit (AMBULATORY_SURGERY_CENTER): Payer: 59 | Admitting: Internal Medicine

## 2020-07-08 ENCOUNTER — Encounter: Payer: Self-pay | Admitting: Internal Medicine

## 2020-07-08 VITALS — BP 105/66 | HR 69 | Temp 98.6°F | Resp 15 | Ht 69.0 in | Wt 175.0 lb

## 2020-07-08 DIAGNOSIS — D123 Benign neoplasm of transverse colon: Secondary | ICD-10-CM | POA: Diagnosis not present

## 2020-07-08 DIAGNOSIS — Z1211 Encounter for screening for malignant neoplasm of colon: Secondary | ICD-10-CM

## 2020-07-08 MED ORDER — SODIUM CHLORIDE 0.9 % IV SOLN: 500.0000 mL | Freq: Once | INTRAVENOUS | Status: DC

## 2020-07-08 NOTE — Progress Notes (Signed)
VS by CW  Pt's states no medical or surgical changes since previsit or office visit.  

## 2020-07-08 NOTE — Op Note (Signed)
[place] Patient Name: Sabrina Harper Procedure Date: 07/08/2020 9:20 AM MRN: [id number] Endoscopist: Gatha Mayer , MD Age: 65 Referring MD:  Date of Birth: [birth year] Gender: Female Account #: [id number] Procedure:                Colonoscopy Indications:              Screening for colorectal malignant neoplasm, Last                            colonoscopy: 2008 Medicines:                Propofol per Anesthesia, Monitored Anesthesia Care Procedure:                Pre-Anesthesia Assessment:                           - Prior to the procedure, a History and Physical                            was performed, and patient medications and                            allergies were reviewed. The patient's tolerance of                            previous anesthesia was also reviewed. The risks                            and benefits of the procedure and the sedation                            options and risks were discussed with the patient.                            All questions were answered, and informed consent                            was obtained. Prior Anticoagulants: The patient has                            taken no previous anticoagulant or antiplatelet                            agents. ASA Grade Assessment: II - A patient with                            mild systemic disease. After reviewing the risks                            and benefits, the patient was deemed in                            satisfactory condition to undergo the procedure.  After obtaining informed consent, the colonoscope                            was passed under direct vision. Throughout the                            procedure, the patient's blood pressure, pulse, and                            oxygen saturations were monitored continuously. The                            Olympus PCF-H190DL [id number]) Colonoscope was                            introduced through  the anus and advanced to the the                            cecum, identified by appendiceal orifice and                            ileocecal valve. The colonoscopy was performed                            without difficulty. The patient tolerated the                            procedure well. The quality of the bowel                            preparation was excellent. The ileocecal valve,                            appendiceal orifice, and rectum were photographed.                            The bowel preparation used was SUPREP via split                            dose instruction. Scope In: 10:03:56 AM Scope Out: 10:17:04 AM Scope Withdrawal Time: 0 hours 8 minutes 53 seconds  Total Procedure Duration: 0 hours 13 minutes 8 seconds  Findings:                 The perianal and digital rectal examinations were                            normal.                           Two sessile polyps were found in the transverse                            colon. The polyps were diminutive in size. These  polyps were removed with a cold snare. Resection                            and retrieval were complete. Verification of                            patient identification for the specimen was done.                            Estimated blood loss was minimal.                           The exam was otherwise without abnormality on                            direct and retroflexion views. Complications:            No immediate complications. Estimated Blood Loss:     Estimated blood loss was minimal. Impression:               - Two diminutive polyps in the transverse colon,                            removed with a cold snare. Resected and retrieved.                           - The examination was otherwise normal on direct                            and retroflexion views. Recommendation:           - Patient has a contact number available for                             emergencies. The signs and symptoms of potential                            delayed complications were discussed with the                            patient. Return to normal activities tomorrow.                            Written discharge instructions were provided to the                            patient.                           - Resume previous diet.                           - Continue present medications.                           - Repeat colonoscopy is recommended. The  colonoscopy date will be determined after pathology                            results from today's exam become available for                            review. Gatha Mayer, MD 07/08/2020 10:23:22 AM This report has been signed electronically.

## 2020-07-08 NOTE — Progress Notes (Signed)
Called to room to assist during endoscopic procedure.  Patient ID and intended procedure confirmed with present staff. Received instructions for my participation in the procedure from the performing physician.  

## 2020-07-08 NOTE — Progress Notes (Signed)
PT taken to PACU. Monitors in place. VSS. Report given to RN. 

## 2020-07-08 NOTE — Patient Instructions (Addendum)
I found and removed 2 tiny polyps. They look benign but may be pre-cancerous. This is common and not a worry.  I will let you know pathology results and when to have another routine colonoscopy by mail and/or My Chart.  I appreciate the opportunity to care for you. Gatha Mayer, MD, FACG  YOU HAD AN ENDOSCOPIC PROCEDURE TODAY AT [facility]:   Refer to the procedure report that was given to you for any specific questions about what was found during the examination.  If the procedure report does not answer your questions, please call your gastroenterologist to clarify.  If you requested that your care partner not be given the details of your procedure findings, then the procedure report has been included in a sealed envelope for you to review at your convenience later.  **Handout given on polyps**  YOU SHOULD EXPECT: Some feelings of bloating in the abdomen. Passage of more gas than usual.  Walking can help get rid of the air that was put into your GI tract during the procedure and reduce the bloating. If you had a lower endoscopy (such as a colonoscopy or flexible sigmoidoscopy) you may notice spotting of blood in your stool or on the toilet paper. If you underwent a bowel prep for your procedure, you may not have a normal bowel movement for a few days.  Please Note:  You might notice some irritation and congestion in your nose or some drainage.  This is from the oxygen used during your procedure.  There is no need for concern and it should clear up in a day or so.  SYMPTOMS TO REPORT IMMEDIATELY:   Following lower endoscopy (colonoscopy or flexible sigmoidoscopy):  Excessive amounts of blood in the stool  Significant tenderness or worsening of abdominal pains  Swelling of the abdomen that is new, acute  Fever of 100F or higher  For urgent or emergent issues, a gastroenterologist can be reached at any hour by calling [phone]. Do not use MyChart messaging for  urgent concerns.    DIET:  We do recommend a small meal at first, but then you may proceed to your regular diet.  Drink plenty of fluids but you should avoid alcoholic beverages for 24 hours.  ACTIVITY:  You should plan to take it easy for the rest of today and you should NOT DRIVE or use heavy machinery until tomorrow (because of the sedation medicines used during the test).    FOLLOW UP: Our staff will call the number listed on your records 48-72 hours following your procedure to check on you and address any questions or concerns that you may have regarding the information given to you following your procedure. If we do not reach you, we will leave a message.  We will attempt to reach you two times.  During this call, we will ask if you have developed any symptoms of COVID 19. If you develop any symptoms (ie: fever, flu-like symptoms, shortness of breath, cough etc.) before then, please call [phone].  If you test positive for Covid 19 in the 2 weeks post procedure, please call and report this information to [place].    If any biopsies were taken you will be contacted by phone or by letter within the next 1-3 weeks.  Please call us at [phone] if you have not heard about the biopsies in 3 weeks.    SIGNATURES/CONFIDENTIALITY: You and/or your care partner have signed paperwork which will be entered into your  electronic medical record.  These signatures attest to the fact that that the information above on your After Visit Summary has been reviewed and is understood.  Full responsibility of the confidentiality of this discharge information lies with you and/or your care-partner.

## 2020-07-10 ENCOUNTER — Telehealth: Payer: Self-pay

## 2020-07-10 NOTE — Telephone Encounter (Signed)
  Follow up Call-  Call back number 07/08/2020  Post procedure Call Back phone  # [phone]  Permission to leave phone message Yes  Some recent data might be hidden     Patient questions:  Do you have a fever, pain , or abdominal swelling? No. Pain Score  0 *  Have you tolerated food without any problems? Yes.    Have you been able to return to your normal activities? Yes.    Do you have any questions about your discharge instructions: Diet   No. Medications  No. Follow up visit  No.  Do you have questions or concerns about your Care? No.  Actions: * If pain score is 4 or above: No action needed, pain <4.

## 2020-07-17 ENCOUNTER — Encounter: Payer: Self-pay | Admitting: Internal Medicine

## 2020-07-17 DIAGNOSIS — Z86010 Personal history of colonic polyps: Secondary | ICD-10-CM

## 2020-07-17 DIAGNOSIS — Z860101 Personal history of adenomatous and serrated colon polyps: Secondary | ICD-10-CM

## 2020-07-17 HISTORY — DX: Personal history of adenomatous and serrated colon polyps: Z86.0101

## 2020-07-17 HISTORY — DX: Personal history of colonic polyps: Z86.010

## 2021-02-20 ENCOUNTER — Ambulatory Visit (INDEPENDENT_AMBULATORY_CARE_PROVIDER_SITE_OTHER): Payer: 59

## 2021-02-20 ENCOUNTER — Ambulatory Visit: Payer: Self-pay

## 2021-02-20 ENCOUNTER — Ambulatory Visit (INDEPENDENT_AMBULATORY_CARE_PROVIDER_SITE_OTHER): Payer: 59 | Admitting: Orthopedic Surgery

## 2021-02-20 DIAGNOSIS — M25561 Pain in right knee: Secondary | ICD-10-CM

## 2021-02-20 DIAGNOSIS — M545 Low back pain, unspecified: Secondary | ICD-10-CM | POA: Diagnosis not present

## 2021-02-20 DIAGNOSIS — G8929 Other chronic pain: Secondary | ICD-10-CM

## 2021-02-20 DIAGNOSIS — M25562 Pain in left knee: Secondary | ICD-10-CM

## 2021-02-20 MED ORDER — PREDNISONE 10 MG PO TABS: 10.0000 mg | ORAL_TABLET | Freq: Every day | ORAL | 0 refills | Status: DC

## 2021-02-21 ENCOUNTER — Encounter: Payer: Self-pay | Admitting: Orthopedic Surgery

## 2021-02-21 DIAGNOSIS — M25562 Pain in left knee: Secondary | ICD-10-CM

## 2021-02-21 MED ORDER — LIDOCAINE HCL (PF) 1 % IJ SOLN
5.0000 mL | INTRAMUSCULAR | Status: AC | PRN
  Administered 2021-02-21: 5 mL

## 2021-02-21 MED ORDER — METHYLPREDNISOLONE ACETATE 40 MG/ML IJ SUSP
40.0000 mg | INTRAMUSCULAR | Status: AC | PRN
  Administered 2021-02-21: 40 mg via INTRA_ARTICULAR

## 2021-02-21 NOTE — Progress Notes (Signed)
Office Visit Note   Patient: Sabrina Harper           Date of Birth: May 21, 1955           MRN: [id number] Visit Date: 02/20/2021              Requested by: Aura Dials, PA-C [place],  [place] [address] PCP: Aura Dials, PA-C  Chief Complaint  Patient presents with   Right Knee - Pain   Left Knee - Pain     Down sustaining an injury to her knees.  She states that Monday and Tuesday she could not walk.  Patient also has lower back pain which started yesterday she states she sat down and almost fell out of the chair due to the pain.  She has no known injury for this she did not hit her back when she fell.  Patient states she has had some left sided sciatic symptoms for the past month. HPI: Patient is a 65 year old woman who seen for initial evaluation for bilateral knee pain and lower back pain.  Patient states she is been limping today she states the left knee pain is getting a little bit better right knee has been chronic with history of meniscal repair.  Patient states that her dog pulled her  Assessment & Plan: Visit Diagnoses:  1. Chronic pain of both knees   2. Chronic midline low back pain without sciatica     Plan: Her left knee was injected she was provided a prescription for prednisone for the sciatic symptoms.  Follow-Up Instructions: Return in about 4 weeks (around 03/20/2021).   Ortho Exam  Patient is alert, oriented, no adenopathy, well-dressed, normal affect, normal respiratory effort. Patient states her acute coccyx pain is getting better.  She has a negative straight leg raise and no focal motor weakness in either lower extremity.  She does have some crepitation with range of motion of the left knee with stable collaterals and cruciates there is no effusion the medial lateral joint lines are minimally tender to palpation.  Patient states the left-sided radicular pain goes from the left buttocks to the left foot.She states her right knee is  asymptomatic at this time.  Imaging: No results found. No images are attached to the encounter.  Labs: No results found for: HGBA1C, ESRSEDRATE, CRP, LABURIC, REPTSTATUS, GRAMSTAIN, CULT, LABORGA   Lab Results  Component Value Date   ALBUMIN 4.5 04/05/2019   ALBUMIN 4.5 03/24/2017   ALBUMIN 4.4 01/09/2015    No results found for: MG Lab Results  Component Value Date   VD25OH 36.1 04/05/2019   VD25OH 31.3 03/24/2017   VD25OH 32 01/09/2015    No results found for: PREALBUMIN CBC EXTENDED Latest Ref Rng & Units 04/05/2019 03/24/2017 01/09/2015  WBC 3.4 - 10.8 x10E3/uL 6.2 5.6 5.1  RBC 3.77 - 5.28 x10E6/uL 4.42 4.15 4.26  HGB 11.1 - 15.9 g/dL 14.0 13.1 13.5  HCT 34.0 - 46.6 % 41.4 39.1 39.9  PLT 150 - 450 x10E3/uL 245 248 251     There is no height or weight on file to calculate BMI.  Orders:  Orders Placed This Encounter  Procedures   XR Lumbar Spine 2-3 Views   XR Knee 1-2 Views Right   XR Knee 1-2 Views Left   Meds ordered this encounter  Medications   predniSONE (DELTASONE) 10 MG tablet    Sig: Take 1 tablet (10 mg total) by mouth daily with breakfast.  Dispense:  30 tablet    Refill:  0     Procedures: Large Joint Inj: L knee on 02/21/2021 4:34 PM Indications: pain and diagnostic evaluation Details: 22 G 1.5 in needle, anteromedial approach  Arthrogram: No  Medications: 5 mL lidocaine (PF) 1 %; 40 mg methylPREDNISolone acetate 40 MG/ML Outcome: tolerated well, no immediate complications Procedure, treatment alternatives, risks and benefits explained, specific risks discussed. Consent was given by the patient. Immediately prior to procedure a time out was called to verify the correct patient, procedure, equipment, support staff and site/side marked as required. Patient was prepped and draped in the usual sterile fashion.     Clinical Data: No additional findings.  ROS:  All other systems negative, except as noted in the HPI. Review of  Systems  Objective: Vital Signs: LMP 03/09/2004   Specialty Comments:  No specialty comments available.  PMFS History: Patient Active Problem List   Diagnosis Date Noted   Hx of adenomatous colonic polyps 07/17/2020   Past Medical History:  Diagnosis Date   CIN II (cervical intraepithelial neoplasia II) 1994   Hx of adenomatous colonic polyps 07/17/2020   2 diminutive adenomas   Ringing of ears     Family History  Problem Relation Age of Onset   Osteoporosis Mother    Hypertension Mother    Kidney cancer Father        deceased   Heart attack Brother 90   Hypertension Brother    Hypertension Brother    Hypertension Maternal Grandmother    Colon cancer Neg Hx    Colon polyps Neg Hx    Esophageal cancer Neg Hx    Rectal cancer Neg Hx    Stomach cancer Neg Hx     Past Surgical History:  Procedure Laterality Date   AUGMENTATION MAMMAPLASTY  2002   -saline   CERVICAL BIOPSY  W/ LOOP ELECTRODE EXCISION  1994   CIN II   CESAREAN SECTION     x2   COLONOSCOPY  12/30/2006   [name]    HYSTEROSCOPY  2000   resection of polyp   KNEE ARTHROSCOPY Right 2006   TUBAL LIGATION     Social History   Occupational History   Not on file  Tobacco Use   Smoking status: Never   Smokeless tobacco: Never  Vaping Use   Vaping Use: Never used  Substance and Sexual Activity   Alcohol use: No   Drug use: No   Sexual activity: Yes    Partners: Male    Birth control/protection: Surgical    Comment: Tubal

## 2021-02-26 ENCOUNTER — Telehealth: Payer: Self-pay | Admitting: Orthopedic Surgery

## 2021-02-26 NOTE — Telephone Encounter (Signed)
Pt was seen on 02/20/21 for chronic bilateral knee pain and chronic midline low back pain. Radiographs were done during this visit. Please advise.

## 2021-02-26 NOTE — Telephone Encounter (Signed)
Pt requesting a referral for MRI. Pt states discussion was next step MRI. Please call pt about this matter at [phone]

## 2021-02-27 ENCOUNTER — Other Ambulatory Visit: Payer: Self-pay | Admitting: Orthopedic Surgery

## 2021-02-27 DIAGNOSIS — M25562 Pain in left knee: Secondary | ICD-10-CM

## 2021-02-27 NOTE — Telephone Encounter (Signed)
I spoke with pt, she says her left knee is giving her the most trouble. I will put in MRI order.

## 2021-02-27 NOTE — Telephone Encounter (Signed)
MRI order placed.

## 2021-03-07 ENCOUNTER — Other Ambulatory Visit: Payer: Self-pay

## 2021-03-07 ENCOUNTER — Ambulatory Visit (HOSPITAL_COMMUNITY)
Admission: RE | Admit: 2021-03-07 | Discharge: 2021-03-07 | Disposition: A | Discharge status: Home / Self Care | Payer: 59 | Source: Ambulatory Visit | Attending: Orthopedic Surgery | Admitting: Orthopedic Surgery

## 2021-03-07 DIAGNOSIS — M25562 Pain in left knee: Secondary | ICD-10-CM | POA: Diagnosis present

## 2021-03-17 ENCOUNTER — Ambulatory Visit (INDEPENDENT_AMBULATORY_CARE_PROVIDER_SITE_OTHER): Payer: Self-pay | Admitting: Orthopedic Surgery

## 2021-03-17 ENCOUNTER — Encounter: Payer: Self-pay | Admitting: Orthopedic Surgery

## 2021-03-17 DIAGNOSIS — M25562 Pain in left knee: Secondary | ICD-10-CM

## 2021-03-17 DIAGNOSIS — M25561 Pain in right knee: Secondary | ICD-10-CM

## 2021-03-17 DIAGNOSIS — G8929 Other chronic pain: Secondary | ICD-10-CM

## 2021-03-17 NOTE — Progress Notes (Signed)
Office Visit Note   Patient: Sabrina Harper           Date of Birth: 16-Nov-1955           MRN: [id number] Visit Date: 03/17/2021              Requested by: Aura Dials, PA-C [place],  [place] [address] PCP: Aura Dials, PA-C  Chief Complaint  Patient presents with   Left Knee - Follow-up    MRI review       HPI: Patient is a 66 year old woman who presents in follow-up for MRI scan left knee.  She states she is doing better she feels like using the rowing machine has improved her knee.  Assessment & Plan: Visit Diagnoses:  1. Chronic pain of both knees     Plan: Recommend continue with exercise and strengthening and using Voltaren gel.  Follow-Up Instructions: Return if symptoms worsen or fail to improve.   Ortho Exam  Patient is alert, oriented, no adenopathy, well-dressed, normal affect, normal respiratory effort. Examination there is no swelling no cellulitis there is minimal tenderness to palpation of the medial joint line.  Review of the MRI scan does show arthritis with a possible small tear in the medial meniscus without displacement.  This area is not tender to palpation.  Imaging: No results found. No images are attached to the encounter.  Labs: No results found for: HGBA1C, ESRSEDRATE, CRP, LABURIC, REPTSTATUS, GRAMSTAIN, CULT, LABORGA   Lab Results  Component Value Date   ALBUMIN 4.5 04/05/2019   ALBUMIN 4.5 03/24/2017   ALBUMIN 4.4 01/09/2015    No results found for: MG Lab Results  Component Value Date   VD25OH 36.1 04/05/2019   VD25OH 31.3 03/24/2017   VD25OH 32 01/09/2015    No results found for: PREALBUMIN CBC EXTENDED Latest Ref Rng & Units 04/05/2019 03/24/2017 01/09/2015  WBC 3.4 - 10.8 x10E3/uL 6.2 5.6 5.1  RBC 3.77 - 5.28 x10E6/uL 4.42 4.15 4.26  HGB 11.1 - 15.9 g/dL 14.0 13.1 13.5  HCT 34.0 - 46.6 % 41.4 39.1 39.9  PLT 150 - 450 x10E3/uL 245 248 251     There is no height or weight on file to calculate  BMI.  Orders:  No orders of the defined types were placed in this encounter.  No orders of the defined types were placed in this encounter.    Procedures: No procedures performed  Clinical Data: No additional findings.  ROS:  All other systems negative, except as noted in the HPI. Review of Systems  Objective: Vital Signs: LMP 03/09/2004   Specialty Comments:  No specialty comments available.  PMFS History: Patient Active Problem List   Diagnosis Date Noted   Hx of adenomatous colonic polyps 07/17/2020   Past Medical History:  Diagnosis Date   CIN II (cervical intraepithelial neoplasia II) 1994   Hx of adenomatous colonic polyps 07/17/2020   2 diminutive adenomas   Ringing of ears     Family History  Problem Relation Age of Onset   Osteoporosis Mother    Hypertension Mother    Kidney cancer Father        deceased   Heart attack Brother 109   Hypertension Brother    Hypertension Brother    Hypertension Maternal Grandmother    Colon cancer Neg Hx    Colon polyps Neg Hx    Esophageal cancer Neg Hx    Rectal cancer Neg Hx    Stomach  cancer Neg Hx     Past Surgical History:  Procedure Laterality Date   AUGMENTATION MAMMAPLASTY  2002   -saline   CERVICAL BIOPSY  W/ LOOP ELECTRODE EXCISION  1994   CIN II   CESAREAN SECTION     x2   COLONOSCOPY  12/30/2006   [name]    HYSTEROSCOPY  2000   resection of polyp   KNEE ARTHROSCOPY Right 2006   TUBAL LIGATION     Social History   Occupational History   Not on file  Tobacco Use   Smoking status: Never   Smokeless tobacco: Never  Vaping Use   Vaping Use: Never used  Substance and Sexual Activity   Alcohol use: No   Drug use: No   Sexual activity: Yes    Partners: Male    Birth control/protection: Surgical    Comment: Tubal

## 2021-06-10 DIAGNOSIS — H524 Presbyopia: Secondary | ICD-10-CM | POA: Diagnosis not present

## 2021-06-10 DIAGNOSIS — H52223 Regular astigmatism, bilateral: Secondary | ICD-10-CM | POA: Diagnosis not present

## 2021-06-10 DIAGNOSIS — Z961 Presence of intraocular lens: Secondary | ICD-10-CM | POA: Diagnosis not present

## 2021-06-12 ENCOUNTER — Encounter: Payer: Self-pay | Admitting: Family Medicine

## 2021-06-12 ENCOUNTER — Ambulatory Visit: Payer: Self-pay | Admitting: Family Medicine

## 2021-06-12 ENCOUNTER — Ambulatory Visit (INDEPENDENT_AMBULATORY_CARE_PROVIDER_SITE_OTHER): Payer: No Typology Code available for payment source | Admitting: Family Medicine

## 2021-06-12 VITALS — BP 118/72 | HR 64 | Temp 98.1°F | Ht 69.25 in | Wt 173.5 lb

## 2021-06-12 DIAGNOSIS — Z131 Encounter for screening for diabetes mellitus: Secondary | ICD-10-CM

## 2021-06-12 DIAGNOSIS — Z13220 Encounter for screening for lipoid disorders: Secondary | ICD-10-CM | POA: Diagnosis not present

## 2021-06-12 DIAGNOSIS — Z0000 Encounter for general adult medical examination without abnormal findings: Secondary | ICD-10-CM

## 2021-06-12 LAB — COMPREHENSIVE METABOLIC PANEL
ALT: 18 U/L (ref 0–35)
AST: 18 U/L (ref 0–37)
Albumin: 4.4 g/dL (ref 3.5–5.2)
Alkaline Phosphatase: 55 U/L (ref 39–117)
BUN: 14 mg/dL (ref 6–23)
CO2: 28 mEq/L (ref 19–32)
Calcium: 9.7 mg/dL (ref 8.4–10.5)
Chloride: 105 mEq/L (ref 96–112)
Creatinine, Ser: 0.58 mg/dL (ref 0.40–1.20)
GFR: 95.26 mL/min (ref >60.00)
Glucose, Bld: 94 mg/dL (ref 70–99)
Potassium: 4.3 mEq/L (ref 3.5–5.1)
Sodium: 140 mEq/L (ref 135–145)
Total Bilirubin: 0.4 mg/dL (ref 0.2–1.2)
Total Protein: 7.2 g/dL (ref 6.0–8.3)

## 2021-06-12 LAB — LIPID PANEL
Cholesterol: 185 mg/dL (ref 0–200)
HDL: 53.2 mg/dL (ref >39.00)
LDL Cholesterol: 120 mg/dL — ABNORMAL HIGH (ref 0–99)
NonHDL: 132.09
Total CHOL/HDL Ratio: 3
Triglycerides: 62 mg/dL (ref 0.0–149.0)
VLDL: 12.4 mg/dL (ref 0.0–40.0)

## 2021-06-12 LAB — CBC WITH DIFFERENTIAL/PLATELET
Basophils Absolute: 0 10*3/uL (ref 0.0–0.1)
Basophils Relative: 0.7 % (ref 0.0–3.0)
Eosinophils Absolute: 0.1 10*3/uL (ref 0.0–0.7)
Eosinophils Relative: 2.2 % (ref 0.0–5.0)
HCT: 37.9 % (ref 36.0–46.0)
Hemoglobin: 12.8 g/dL (ref 12.0–15.0)
Lymphocytes Relative: 45 % (ref 12.0–46.0)
Lymphs Abs: 2.6 10*3/uL (ref 0.7–4.0)
MCHC: 33.8 g/dL (ref 30.0–36.0)
MCV: 94.8 fl (ref 78.0–100.0)
Monocytes Absolute: 0.4 10*3/uL (ref 0.1–1.0)
Monocytes Relative: 6.6 % (ref 3.0–12.0)
Neutro Abs: 2.6 10*3/uL (ref 1.4–7.7)
Neutrophils Relative %: 45.5 % (ref 43.0–77.0)
Platelets: 230 10*3/uL (ref 150.0–400.0)
RBC: 4 Mil/uL (ref 3.87–5.11)
RDW: 13.4 % (ref 11.5–15.5)
WBC: 5.8 10*3/uL (ref 4.0–10.5)

## 2021-06-12 LAB — TSH: TSH: 1.42 u[IU]/mL (ref 0.35–5.50)

## 2021-06-12 MED ORDER — EPINEPHRINE 0.3 MG/0.3ML IJ SOAJ: 0.3000 mg | Freq: Once | INTRAMUSCULAR | 1 refills | Status: AC | PRN

## 2021-06-12 NOTE — Progress Notes (Signed)
? ?New Patient Office Visit ? ?Subjective:  ?Patient ID: Sabrina Harper, female    DOB: 01-06-1956  Age: 66 y.o. MRN: [id number] ? ?CC:  ?Chief Complaint  ?Patient presents with  ? Establish Care  ?  Need a welcome to medicare visit if time permits ?Fasting ? ?  ? ? ?HPI ?[name] presents for new pt.  New medicare advantage program. CPX ?Last pap-2+yrs  mamm tomorrow. Cscope 1 yr ago-had 2 polyps-7 yrs. ? ?Past Medical History:  ?Diagnosis Date  ? Arthritis   ? CIN II (cervical intraepithelial neoplasia II) 1994  ? Hx of adenomatous colonic polyps 07/17/2020  ? 2 diminutive adenomas  ? Ringing of ears   ? ? ?Past Surgical History:  ?Procedure Laterality Date  ? AUGMENTATION MAMMAPLASTY  2002  ? -saline  ? CERVICAL BIOPSY  W/ LOOP ELECTRODE EXCISION  1994  ? CIN II  ? CERVICAL CONE BIOPSY  1994  ? CESAREAN SECTION    ? x2, 1982, 1988  ? COLONOSCOPY  12/30/2006  ? [name]   ? HYSTEROSCOPY  2000  ? resection of polyp  ? KNEE ARTHROSCOPY Right 2006  ? TUBAL LIGATION    ? ? ?Family History  ?Problem Relation Age of Onset  ? Osteoporosis Mother   ? Hypertension Mother   ? Kidney cancer Father   ?     deceased  ? Heart attack Brother 32  ? Hypertension Brother   ? Hypertension Brother   ? Hypertension Maternal Grandmother   ? Heart attack Maternal Grandfather   ? Arthritis Paternal Grandmother   ? Colon cancer Neg Hx   ? Colon polyps Neg Hx   ? Esophageal cancer Neg Hx   ? Rectal cancer Neg Hx   ? Stomach cancer Neg Hx   ? ? ?Social History  ? ?Socioeconomic History  ? Marital status: Married  ?  Spouse name: Not on file  ? Number of children: 3  ? Years of education: Not on file  ? Highest education level: Not on file  ?Occupational History  ? Not on file  ?Tobacco Use  ? Smoking status: Never  ? Smokeless tobacco: Never  ?Vaping Use  ? Vaping Use: Never used  ?Substance and Sexual Activity  ? Alcohol use: Yes  ?  Comment: wine socially  ? Drug use: Never  ? Sexual activity: Yes  ?  Partners: Male  ?  Birth  control/protection: Surgical  ?  Comment: Tubal  ?Other Topics Concern  ? Not on file  ?Social History Narrative  ? 5 grandsons  ? Retired-med tech  ? ?Social Determinants of Health  ? ?Financial Resource Strain: Not on file  ?Food Insecurity: Not on file  ?Transportation Needs: Not on file  ?Physical Activity: Not on file  ?Stress: Not on file  ?Social Connections: Not on file  ?Intimate Partner Violence: Not on file  ? ? ?ROS  ?ROS: ?Gen: no fever, chills  ?Skin: no rash, itching ?ENT: no ear pain, ear drainage, nasal congestion, rhinorrhea, sinus pressure, sore throat.  Seasonal allergies.   Allergy to wheat straw in past ?Eyes: no blurry vision, double vision.  Saw ophth recently.. some hemorrage on retina-sees in 2 mo ?Resp: no cough, wheeze,SOB ?CV: no CP, palpitations, LE edema,  ?GI: no heartburn, n/v/d/c, abd pain.  Occ belching ?GU: no dysuria, urgency, frequency, hematuria ?MSK: OA knees-seeing ortho ?Neuro: no dizziness, headache, weakness, vertigo ?Psych: no depression, anxiety, insomnia, SI.   Occ tylenol pm. Occ  melatonin ? ?Objective:  ? ?Today's Vitals: BP 118/72   Pulse 64   Temp 98.1 ?F (36.7 ?C) (Temporal)   Ht 5' 9.25" (1.759 m)   Wt 173 lb 8 oz (78.7 kg)   LMP 03/09/2004   SpO2 99%   BMI 25.44 kg/m?  ? ?Physical Exam  ?Gen: WDWN NAD ?HEENT: NCAT, conjunctiva not injected, sclera nonicteric ?TM WNL B, OP moist, no exudates  ?NECK:  supple, no thyromegaly, no nodes, no carotid bruits ?CARDIAC: RRR, S1S2+, no murmur. DP 2+B ?LUNGS: CTAB. No wheezes ?ABDOMEN:  BS+, soft, NTND, No HSM, no masses ?EXT:  no edema ?MSK: no gross abnormalities.  Ms 5/5 all 4 ?NEURO: A&O x3.  CN II-XII intact.  ?PSYCH: normal mood. Good eye contact  ? ?Assessment & Plan:  ? ?Problem List Items Addressed This Visit   ?None ?Visit Diagnoses   ? ? Wellness examination    -  Primary  ? ?  ? Wellness-[facility] guidance.  Stay active.  Check cbc,cmp,tsh,lipids-advised insurance may not cover-she's willing to pay.     Refilled epi pen    f/u 1 yr w/pap ?RHM UTD(mamm tomorrow). ? ?Outpatient Encounter Medications as of 06/12/2021  ?Medication Sig  ? Calcium Carbonate Antacid (CALCIUM CARBONATE PO) Take 2 tablets by mouth daily. 2 gummies daily  ? EPINEPHrine 0.3 mg/0.3 mL IJ SOAJ injection Inject 0.3 mg into the muscle once as needed (anaphylaxis/allergic reaction).  ? fluticasone (FLONASE) 50 MCG/ACT nasal spray Place 1 spray into both nostrils daily.  ? Multiple Vitamin (MULTIVITAMINS PO) Take 2 capsules by mouth daily. 2 gummies daily  ? TURMERIC PO Take 15 mLs by mouth daily.  ? [DISCONTINUED] EPINEPHrine (EPIPEN JR) 0.15 MG/0.3ML injection Inject 0.3 mLs (0.15 mg total) into the muscle as needed for anaphylaxis. (Patient not taking: Reported on 03/31/2018)  ? [DISCONTINUED] predniSONE (DELTASONE) 10 MG tablet Take 1 tablet (10 mg total) by mouth daily with breakfast. (Patient not taking: Reported on 06/12/2021)  ? ?No facility-administered encounter medications on file as of 06/12/2021.  ? ? ?Follow-up: No follow-ups on file.  ? ?Wellington Hampshire, MD ?

## 2021-06-12 NOTE — Patient Instructions (Signed)
Welcome to [facility] at [facility]! It was a pleasure meeting you today. ° °As discussed, Please schedule a 12 month follow up visit today. ° °PLEASE NOTE: ° °If you had any LAB tests please let us know if you have not heard back within a few days. You may see your results on MyChart before we have a chance to review them but we will give you a call once they are reviewed by us. If we ordered any REFERRALS today, please let us know if you have not heard from their office within the next week.  °Let us know through MyChart if you are needing REFILLS, or have your pharmacy send us the request. You can also use MyChart to communicate with me or any office staff. ° °Please try these tips to maintain a healthy lifestyle: ° °Eat most of your calories during the day when you are active. Eliminate processed foods including packaged sweets (pies, cakes, cookies), reduce intake of potatoes, white bread, white pasta, and white rice. Look for whole grain options, oat flour or almond flour. ° °Each meal should contain half fruits/vegetables, one quarter protein, and one quarter carbs (no bigger than a computer mouse). ° °Cut down on sweet beverages. This includes juice, soda, and sweet tea. Also watch fruit intake, though this is a healthier sweet option, it still contains natural sugar! Limit to 3 servings daily. ° °Drink at least 1 glass of water with each meal and aim for at least 8 glasses per day ° °Exercise at least 150 minutes every week.   °

## 2021-06-13 DIAGNOSIS — Z1231 Encounter for screening mammogram for malignant neoplasm of breast: Secondary | ICD-10-CM | POA: Diagnosis not present

## 2021-06-21 LAB — HM MAMMOGRAPHY

## 2021-06-27 ENCOUNTER — Encounter: Payer: Self-pay | Admitting: Family Medicine

## 2021-08-11 DIAGNOSIS — H348322 Tributary (branch) retinal vein occlusion, left eye, stable: Secondary | ICD-10-CM | POA: Diagnosis not present

## 2021-08-21 ENCOUNTER — Ambulatory Visit (INDEPENDENT_AMBULATORY_CARE_PROVIDER_SITE_OTHER): Payer: No Typology Code available for payment source | Admitting: Orthopedic Surgery

## 2021-08-21 DIAGNOSIS — G8929 Other chronic pain: Secondary | ICD-10-CM

## 2021-08-24 ENCOUNTER — Encounter: Payer: Self-pay | Admitting: Orthopedic Surgery

## 2021-08-24 DIAGNOSIS — M25561 Pain in right knee: Secondary | ICD-10-CM | POA: Diagnosis not present

## 2021-08-24 DIAGNOSIS — G8929 Other chronic pain: Secondary | ICD-10-CM | POA: Diagnosis not present

## 2021-08-24 DIAGNOSIS — M25562 Pain in left knee: Secondary | ICD-10-CM

## 2021-08-24 NOTE — Progress Notes (Signed)
Office Visit Note   Patient: Sabrina Harper           Date of Birth: 1955-05-03           MRN: [id number] Visit Date: 08/21/2021              Requested by: Tawnya Crook, MD [place],  [place] [address] PCP: Tawnya Crook, MD  Chief Complaint  Patient presents with   Right Knee - Pain   Left Knee - Pain      HPI: Patient is a 66 year old woman with osteoarthritis bilateral knees.  Patient has had about 3 months relief from her previous intra-articular injection.  Patient complains of pain over the medial joint line bilaterally.  Assessment & Plan: Visit Diagnoses:  1. Chronic pain of both knees     Plan: Both knees were injected she tolerated this well reevaluate in 4 weeks for consideration for hyaluronic injection.  Follow-Up Instructions: Return in about 4 weeks (around 09/18/2021).   Ortho Exam  Patient is alert, oriented, no adenopathy, well-dressed, normal affect, normal respiratory effort. Examination patient has no redness no cellulitis no effusion of either knee.  There is crepitation with range of motion bilaterally collaterals and cruciates are stable she is tender to palp patient primarily over the medial joint line bilaterally.  Imaging: No results found. No images are attached to the encounter.  Labs: No results found for: "HGBA1C", "ESRSEDRATE", "CRP", "LABURIC", "REPTSTATUS", "GRAMSTAIN", "CULT", "LABORGA"   Lab Results  Component Value Date   ALBUMIN 4.4 06/12/2021   ALBUMIN 4.5 04/05/2019   ALBUMIN 4.5 03/24/2017    No results found for: "MG" Lab Results  Component Value Date   VD25OH 36.1 04/05/2019   VD25OH 31.3 03/24/2017   VD25OH 32 01/09/2015    No results found for: "PREALBUMIN"    Latest Ref Rng & Units 06/12/2021   10:32 AM 04/05/2019   10:27 AM 03/24/2017    1:34 PM  CBC EXTENDED  WBC 4.0 - 10.5 K/uL 5.8  6.2  5.6   RBC 3.87 - 5.11 Mil/uL 4.00  4.42  4.15   Hemoglobin 12.0 - 15.0 g/dL 12.8  14.0   13.1   HCT 36.0 - 46.0 % 37.9  41.4  39.1   Platelets 150.0 - 400.0 K/uL 230.0  245  248   NEUT# 1.4 - 7.7 K/uL 2.6     Lymph# 0.7 - 4.0 K/uL 2.6        There is no height or weight on file to calculate BMI.  Orders:  No orders of the defined types were placed in this encounter.  No orders of the defined types were placed in this encounter.    Procedures: Large Joint Inj: bilateral knee on 08/24/2021 2:33 PM Indications: pain and diagnostic evaluation Details: 22 G 1.5 in needle, anteromedial approach  Arthrogram: No  Outcome: tolerated well, no immediate complications Procedure, treatment alternatives, risks and benefits explained, specific risks discussed. Consent was given by the patient. Immediately prior to procedure a time out was called to verify the correct patient, procedure, equipment, support staff and site/side marked as required. Patient was prepped and draped in the usual sterile fashion.      Clinical Data: No additional findings.  ROS:  All other systems negative, except as noted in the HPI. Review of Systems  Objective: Vital Signs: LMP 03/09/2004   Specialty Comments:  No specialty comments available.  PMFS History: Patient Active Problem List  Diagnosis Date Noted   Hx of adenomatous colonic polyps 07/17/2020   Past Medical History:  Diagnosis Date   Arthritis    CIN II (cervical intraepithelial neoplasia II) 1994   Hx of adenomatous colonic polyps 07/17/2020   2 diminutive adenomas   Ringing of ears     Family History  Problem Relation Age of Onset   Osteoporosis Mother    Hypertension Mother    Kidney cancer Father        deceased   Heart attack Brother 60   Hypertension Brother    Hypertension Brother    Hypertension Maternal Grandmother    Heart attack Maternal Grandfather    Arthritis Paternal Grandmother    Colon cancer Neg Hx    Colon polyps Neg Hx    Esophageal cancer Neg Hx    Rectal cancer Neg Hx    Stomach cancer  Neg Hx     Past Surgical History:  Procedure Laterality Date   AUGMENTATION MAMMAPLASTY  2002   -saline   CERVICAL BIOPSY  W/ LOOP ELECTRODE EXCISION  1994   CIN II   CERVICAL CONE BIOPSY  1994   CESAREAN SECTION     x2, 1982, 1988   COLONOSCOPY  12/30/2006   [facility]    HYSTEROSCOPY  2000   resection of polyp   KNEE ARTHROSCOPY Right 2006   TUBAL LIGATION     Social History   Occupational History   Not on file  Tobacco Use   Smoking status: Never   Smokeless tobacco: Never  Vaping Use   Vaping Use: Never used  Substance and Sexual Activity   Alcohol use: Yes    Comment: wine socially   Drug use: Never   Sexual activity: Yes    Partners: Male    Birth control/protection: Surgical    Comment: Tubal

## 2021-09-16 ENCOUNTER — Telehealth: Payer: Self-pay | Admitting: Orthopedic Surgery

## 2021-09-16 NOTE — Telephone Encounter (Signed)
Talked with patient and advised her that I didn't receive a message to submit for gel injection. Patient stated that she still would like to get the gel injection. Submitted for Monovisc, bilateral knee BV pending

## 2021-09-16 NOTE — Telephone Encounter (Signed)
Pt called requesting a call back. Pt states she has waited a month for approval for gel injection. Please call pt at [phone].

## 2021-09-26 ENCOUNTER — Telehealth: Payer: Self-pay

## 2021-09-26 DIAGNOSIS — G8929 Other chronic pain: Secondary | ICD-10-CM

## 2021-09-26 NOTE — Telephone Encounter (Signed)
Called and left a VM advising patient that she is approved for gel injection and to CB to schedule with Dr. Sharol Given or Junie Panning.

## 2021-09-26 NOTE — Telephone Encounter (Signed)
Faxed completed PA form to [employer] at [phone]. PA pending

## 2021-09-29 ENCOUNTER — Ambulatory Visit (INDEPENDENT_AMBULATORY_CARE_PROVIDER_SITE_OTHER): Payer: No Typology Code available for payment source | Admitting: Orthopedic Surgery

## 2021-09-29 ENCOUNTER — Encounter: Payer: Self-pay | Admitting: Orthopedic Surgery

## 2021-09-29 DIAGNOSIS — M170 Bilateral primary osteoarthritis of knee: Secondary | ICD-10-CM | POA: Diagnosis not present

## 2021-09-29 DIAGNOSIS — M25562 Pain in left knee: Secondary | ICD-10-CM | POA: Diagnosis not present

## 2021-09-29 DIAGNOSIS — M25561 Pain in right knee: Secondary | ICD-10-CM

## 2021-09-29 DIAGNOSIS — M79671 Pain in right foot: Secondary | ICD-10-CM

## 2021-09-29 DIAGNOSIS — G8929 Other chronic pain: Secondary | ICD-10-CM | POA: Diagnosis not present

## 2021-09-29 MED ORDER — HYALURONAN 88 MG/4ML IX SOSY
88.0000 mg | PREFILLED_SYRINGE | INTRA_ARTICULAR | Status: AC | PRN
  Administered 2021-09-29: 88 mg via INTRA_ARTICULAR

## 2021-09-29 NOTE — Progress Notes (Signed)
Office Visit Note   Patient: Sabrina Harper           Date of Birth: 09/11/1955           MRN: [id number] Visit Date: 09/29/2021              Requested by: Tawnya Crook, MD [place],  [place] [address] PCP: Tawnya Crook, MD  Chief Complaint  Patient presents with   Right Knee - Follow-up    monovisc   Left Knee - Follow-up    monovisc      HPI: Patient is a 66 year old woman with osteoarthritis bilateral knees.  She has had temporary relief with steroid injections in the past and presents at this time for evaluation of hyaluronic acid injection.  Assessment & Plan: Visit Diagnoses:  1. Chronic pain of both knees     Plan: Both knees were injected without complications.  Follow-Up Instructions: Return if symptoms worsen or fail to improve.   Ortho Exam  Patient is alert, oriented, no adenopathy, well-dressed, normal affect, normal respiratory effort. Examination of both knees she has a mild effusion collaterals and cruciates are stable there is no redness no cellulitis no signs of infection.  Patient also has pain across the midfoot on the right.  Examination she does have tenderness and swelling across the midfoot secondary to recent trauma.  Recommended a stiff soled sneaker.  Imaging: No results found. No images are attached to the encounter.  Labs: No results found for: "HGBA1C", "ESRSEDRATE", "CRP", "LABURIC", "REPTSTATUS", "GRAMSTAIN", "CULT", "LABORGA"   Lab Results  Component Value Date   ALBUMIN 4.4 06/12/2021   ALBUMIN 4.5 04/05/2019   ALBUMIN 4.5 03/24/2017    No results found for: "MG" Lab Results  Component Value Date   VD25OH 36.1 04/05/2019   VD25OH 31.3 03/24/2017   VD25OH 32 01/09/2015    No results found for: "PREALBUMIN"    Latest Ref Rng & Units 06/12/2021   10:32 AM 04/05/2019   10:27 AM 03/24/2017    1:34 PM  CBC EXTENDED  WBC 4.0 - 10.5 K/uL 5.8  6.2  5.6   RBC 3.87 - 5.11 Mil/uL 4.00  4.42  4.15    Hemoglobin 12.0 - 15.0 g/dL 12.8  14.0  13.1   HCT 36.0 - 46.0 % 37.9  41.4  39.1   Platelets 150.0 - 400.0 K/uL 230.0  245  248   NEUT# 1.4 - 7.7 K/uL 2.6     Lymph# 0.7 - 4.0 K/uL 2.6        There is no height or weight on file to calculate BMI.  Orders:  No orders of the defined types were placed in this encounter.  No orders of the defined types were placed in this encounter.    Procedures: Large Joint Inj: bilateral knee on 09/29/2021 4:56 PM Indications: pain and diagnostic evaluation Details: 22 G 1.5 in needle, anteromedial approach  Arthrogram: No  Medications (Right): 88 mg Hyaluronan 88 MG/4ML Medications (Left): 88 mg Hyaluronan 88 MG/4ML Outcome: tolerated well, no immediate complications Procedure, treatment alternatives, risks and benefits explained, specific risks discussed. Consent was given by the patient. Immediately prior to procedure a time out was called to verify the correct patient, procedure, equipment, support staff and site/side marked as required. Patient was prepped and draped in the usual sterile fashion.      Clinical Data: No additional findings.  ROS:  All other systems negative, except as noted in the  HPI. Review of Systems  Objective: Vital Signs: LMP 03/09/2004   Specialty Comments:  No specialty comments available.  PMFS History: Patient Active Problem List   Diagnosis Date Noted   Hx of adenomatous colonic polyps 07/17/2020   Past Medical History:  Diagnosis Date   Arthritis    CIN II (cervical intraepithelial neoplasia II) 1994   Hx of adenomatous colonic polyps 07/17/2020   2 diminutive adenomas   Ringing of ears     Family History  Problem Relation Age of Onset   Osteoporosis Mother    Hypertension Mother    Kidney cancer Father        deceased   Heart attack Brother 57   Hypertension Brother    Hypertension Brother    Hypertension Maternal Grandmother    Heart attack Maternal Grandfather    Arthritis  Paternal Grandmother    Colon cancer Neg Hx    Colon polyps Neg Hx    Esophageal cancer Neg Hx    Rectal cancer Neg Hx    Stomach cancer Neg Hx     Past Surgical History:  Procedure Laterality Date   AUGMENTATION MAMMAPLASTY  2002   -saline   CERVICAL BIOPSY  W/ LOOP ELECTRODE EXCISION  1994   CIN II   CERVICAL CONE BIOPSY  1994   CESAREAN SECTION     x2, 1982, 1988   COLONOSCOPY  12/30/2006   [facility]    HYSTEROSCOPY  2000   resection of polyp   KNEE ARTHROSCOPY Right 2006   TUBAL LIGATION     Social History   Occupational History   Not on file  Tobacco Use   Smoking status: Never   Smokeless tobacco: Never  Vaping Use   Vaping Use: Never used  Substance and Sexual Activity   Alcohol use: Yes    Comment: wine socially   Drug use: Never   Sexual activity: Yes    Partners: Male    Birth control/protection: Surgical    Comment: Tubal

## 2021-09-30 ENCOUNTER — Telehealth: Payer: Self-pay

## 2021-09-30 NOTE — Telephone Encounter (Signed)
Patient called about her right foot questionable stress fracture. Dr. Sharol Given suggested a hard-soled shoe. She is asking if she could get a hard-soled shoe or boot from [place] here. Her cb [phone].

## 2021-09-30 NOTE — Telephone Encounter (Signed)
Pt bought a post op shoe at [employer].

## 2021-10-06 ENCOUNTER — Telehealth: Payer: Self-pay

## 2021-10-06 NOTE — Telephone Encounter (Signed)
Per Dondra Spry with [facility], PA is still pending for Monovisc, bilateral knee. Reference# [id number]

## 2021-10-29 ENCOUNTER — Telehealth: Payer: Self-pay | Admitting: Family Medicine

## 2021-10-29 ENCOUNTER — Other Ambulatory Visit: Payer: Self-pay | Admitting: *Deleted

## 2021-10-29 DIAGNOSIS — G8929 Other chronic pain: Secondary | ICD-10-CM

## 2021-10-29 NOTE — Telephone Encounter (Signed)
Referral placed as requested.

## 2021-10-29 NOTE — Telephone Encounter (Signed)
Patient states: - She has been seeing Dr. Sharol Given, Orthopedic surgery, for the pain in both her knees   Patient requests:  - A referral to Dr. Paralee Cancel, Orthopedic surgeon at [facility] ortho for total left knee replacement - Insurance informed her PCP will need NPI # [id number] in order to place this referral

## 2021-11-26 DIAGNOSIS — Z6825 Body mass index (BMI) 25.0-25.9, adult: Secondary | ICD-10-CM | POA: Diagnosis not present

## 2021-11-26 DIAGNOSIS — Z86010 Personal history of colonic polyps: Secondary | ICD-10-CM | POA: Diagnosis not present

## 2021-11-26 DIAGNOSIS — M1990 Unspecified osteoarthritis, unspecified site: Secondary | ICD-10-CM | POA: Diagnosis not present

## 2021-11-26 DIAGNOSIS — Z008 Encounter for other general examination: Secondary | ICD-10-CM | POA: Diagnosis not present

## 2021-11-26 DIAGNOSIS — E663 Overweight: Secondary | ICD-10-CM | POA: Diagnosis not present

## 2021-11-28 DIAGNOSIS — M25562 Pain in left knee: Secondary | ICD-10-CM | POA: Diagnosis not present

## 2021-11-28 DIAGNOSIS — S83242A Other tear of medial meniscus, current injury, left knee, initial encounter: Secondary | ICD-10-CM | POA: Diagnosis not present

## 2021-11-28 DIAGNOSIS — M25561 Pain in right knee: Secondary | ICD-10-CM | POA: Diagnosis not present

## 2021-12-01 ENCOUNTER — Encounter: Payer: Self-pay | Admitting: *Deleted

## 2022-01-13 NOTE — Progress Notes (Signed)
COVID Vaccine Completed:  Yes  Date of COVID positive in last 90 days:  PCP - Esther Hardy, MD Cardiologist -   Chest x-ray -  EKG -  Stress Test -  ECHO -  Cardiac Cath -  Pacemaker/ICD device last checked: Spinal Cord Stimulator:  Bowel Prep -   Sleep Study -  CPAP -   Fasting Blood Sugar -  Checks Blood Sugar _____ times a day  Last dose of GLP1 agonist-  N/A GLP1 instructions:  N/A   Last dose of SGLT-2 inhibitors-  N/A SGLT-2 instructions: N/A   Blood Thinner Instructions: Aspirin Instructions: Last Dose:  Activity level:  Can go up a flight of stairs and perform activities of daily living without stopping and without symptoms of chest pain or shortness of breath.  Able to exercise without symptoms  Unable to go up a flight of stairs without symptoms of     Anesthesia review:   Patient denies shortness of breath, fever, cough and chest pain at PAT appointment  Patient verbalized understanding of instructions that were given to them at the PAT appointment. Patient was also instructed that they will need to review over the PAT instructions again at home before surgery.

## 2022-01-13 NOTE — Patient Instructions (Signed)
SURGICAL WAITING ROOM VISITATION Patients having surgery or a procedure may have no more than 2 support people in the waiting area - these visitors may rotate.   Children under the age of 45 must have an adult with them who is not the patient. If the patient needs to stay at the hospital during part of their recovery, the visitor guidelines for inpatient rooms apply. Pre-op nurse will coordinate an appropriate time for 1 support person to accompany patient in pre-op.  This support person may not rotate.    Please refer to the [facility] website for the visitor guidelines for Inpatients (after your surgery is over and you are in a regular room).      Your procedure is scheduled on: 01-27-22   Report to [facility] Main Entrance    Report to admitting at 5:15 AM   Call this number if you have problems the morning of surgery [phone]   Do not eat food :After Midnight.   After Midnight you may have the following liquids until 4:15 AM DAY OF SURGERY  Water Non-Citrus Juices (without pulp, NO RED) Carbonated Beverages Black Coffee (NO MILK/CREAM OR CREAMERS, sugar ok)  Clear Tea (NO MILK/CREAM OR CREAMERS, sugar ok) regular and decaf                             Plain Jell-O (NO RED)                                           Fruit ices (not with fruit pulp, NO RED)                                     Popsicles (NO RED)                                                               Sports drinks like Gatorade (NO RED)                   The day of surgery:  Drink ONE (1) Pre-Surgery Clear Ensure at 4:15 AM the morning of surgery. Drink in one sitting. Do not sip.  This drink was given to you during your hospital  pre-op appointment visit. Nothing else to drink after completing the Pre-Surgery Clear Ensure.          If you have questions, please contact your surgeon's office.   FOLLOW ANY ADDITIONAL PRE OP INSTRUCTIONS YOU RECEIVED FROM YOUR SURGEON'S OFFICE!!!      Oral Hygiene is also important to reduce your risk of infection.                                    Remember - BRUSH YOUR TEETH THE MORNING OF SURGERY WITH YOUR REGULAR TOOTHPASTE   Do NOT smoke after Midnight   Take these medicines the morning of surgery with A SIP OF WATER:   Tylenol if needed  You may not have any metal on your body including hair pins, jewelry, and body piercing             Do not wear make-up, lotions, powders, perfumes or deodorant  Do not wear nail polish including gel and S&S, artificial/acrylic nails, or any other type of covering on natural nails including finger and toenails. If you have artificial nails, gel coating, etc. that needs to be removed by a nail salon please have this removed prior to surgery or surgery may need to be canceled/ delayed if the surgeon/ anesthesia feels like they are unable to be safely monitored.   Do not shave  48 hours prior to surgery.    Do not bring valuables to the hospital. [place].   Contacts, dentures or bridgework may not be worn into surgery.  DO NOT [place]. PHARMACY WILL DISPENSE MEDICATIONS LISTED ON YOUR MEDICATION LIST TO YOU DURING YOUR ADMISSION [place]!    Patients discharged on the day of surgery will not be allowed to drive home.  Someone NEEDS to stay with you for the first 24 hours after anesthesia.   Special Instructions: Bring a copy of your healthcare power of attorney and living will documents the day of surgery if you haven't scanned them before.              Please read over the following fact sheets you were given: IF [place]  If you received a COVID test during your pre-op visit  it is requested that you wear a mask when out in public, stay away from anyone that may not be feeling well and notify your surgeon if you develop symptoms. If  you test positive for Covid or have been in contact with anyone that has tested positive in the last 10 days please notify you surgeon.  [facility] - Preparing for Surgery Before surgery, you can play an important role.  Because skin is not sterile, your skin needs to be as free of germs as possible.  You can reduce the number of germs on your skin by washing with CHG (chlorahexidine gluconate) soap before surgery.  CHG is an antiseptic cleaner which kills germs and bonds with the skin to continue killing germs even after washing. Please DO NOT use if you have an allergy to CHG or antibacterial soaps.  If your skin becomes reddened/irritated stop using the CHG and inform your nurse when you arrive at Short Stay. Do not shave (including legs and underarms) for at least 48 hours prior to the first CHG shower.  You may shave your face/neck.  Please follow these instructions carefully:  1.  Shower with CHG Soap the night before surgery and the  morning of surgery.  2.  If you choose to wash your hair, wash your hair first as usual with your normal  shampoo.  3.  After you shampoo, rinse your hair and body thoroughly to remove the shampoo.                             4.  Use CHG as you would any other liquid soap.  You can apply chg directly to the skin and wash.  Gently with a scrungie or clean washcloth.  5.  Apply the CHG Soap to your body ONLY FROM THE  NECK DOWN.   Do   not use on face/ open                           Wound or open sores. Avoid contact with eyes, ears mouth and   genitals (private parts).                       Wash face,  Genitals (private parts) with your normal soap.             6.  Wash thoroughly, paying special attention to the area where your    surgery  will be performed.  7.  Thoroughly rinse your body with warm water from the neck down.  8.  DO NOT shower/wash with your normal soap after using and rinsing off the CHG Soap.                9.  Pat yourself dry with a clean  towel.            10.  Wear clean pajamas.            11.  Place clean sheets on your bed the night of your first shower and do not  sleep with pets. Day of Surgery : Do not apply any lotions/deodorants the morning of surgery.  Please wear clean clothes to the hospital/surgery center.  FAILURE TO FOLLOW THESE INSTRUCTIONS MAY RESULT IN THE CANCELLATION OF YOUR SURGERY  PATIENT SIGNATURE_________________________________  NURSE SIGNATURE__________________________________  ________________________________________________________________________

## 2022-01-15 ENCOUNTER — Encounter (HOSPITAL_COMMUNITY)
Admission: RE | Admit: 2022-01-15 | Discharge: 2022-01-15 | Disposition: A | Discharge status: Home / Self Care | Payer: No Typology Code available for payment source | Source: Ambulatory Visit | Attending: Orthopedic Surgery | Admitting: Orthopedic Surgery

## 2022-01-15 ENCOUNTER — Other Ambulatory Visit: Payer: Self-pay

## 2022-01-15 ENCOUNTER — Encounter (HOSPITAL_COMMUNITY): Payer: Self-pay

## 2022-01-15 DIAGNOSIS — Z01818 Encounter for other preprocedural examination: Secondary | ICD-10-CM | POA: Diagnosis not present

## 2022-01-15 HISTORY — DX: Family history of other specified conditions: Z84.89

## 2022-01-15 HISTORY — DX: Gastro-esophageal reflux disease without esophagitis: K21.9

## 2022-01-15 HISTORY — DX: Unspecified malignant neoplasm of skin, unspecified: C44.90

## 2022-01-27 ENCOUNTER — Ambulatory Visit (HOSPITAL_COMMUNITY)
Admission: RE | Admit: 2022-01-27 | Discharge: 2022-01-27 | Disposition: A | Discharge status: Home / Self Care | Payer: No Typology Code available for payment source | Source: Ambulatory Visit | Attending: Orthopedic Surgery | Admitting: Orthopedic Surgery

## 2022-01-27 ENCOUNTER — Encounter (HOSPITAL_COMMUNITY)
Admission: RE | Disposition: A | Discharge status: Home / Self Care | Payer: Self-pay | Source: Ambulatory Visit | Attending: Orthopedic Surgery

## 2022-01-27 ENCOUNTER — Ambulatory Visit (HOSPITAL_COMMUNITY): Payer: No Typology Code available for payment source | Admitting: Anesthesiology

## 2022-01-27 ENCOUNTER — Encounter (HOSPITAL_COMMUNITY): Payer: Self-pay | Admitting: Orthopedic Surgery

## 2022-01-27 ENCOUNTER — Ambulatory Visit (HOSPITAL_BASED_OUTPATIENT_CLINIC_OR_DEPARTMENT_OTHER): Payer: No Typology Code available for payment source | Admitting: Anesthesiology

## 2022-01-27 ENCOUNTER — Other Ambulatory Visit: Payer: Self-pay

## 2022-01-27 DIAGNOSIS — S83242A Other tear of medial meniscus, current injury, left knee, initial encounter: Secondary | ICD-10-CM | POA: Diagnosis not present

## 2022-01-27 DIAGNOSIS — Z9889 Other specified postprocedural states: Secondary | ICD-10-CM

## 2022-01-27 DIAGNOSIS — M2242 Chondromalacia patellae, left knee: Secondary | ICD-10-CM | POA: Diagnosis not present

## 2022-01-27 DIAGNOSIS — K219 Gastro-esophageal reflux disease without esophagitis: Secondary | ICD-10-CM | POA: Insufficient documentation

## 2022-01-27 DIAGNOSIS — M94262 Chondromalacia, left knee: Secondary | ICD-10-CM

## 2022-01-27 DIAGNOSIS — X58XXXA Exposure to other specified factors, initial encounter: Secondary | ICD-10-CM | POA: Diagnosis not present

## 2022-01-27 DIAGNOSIS — S83232A Complex tear of medial meniscus, current injury, left knee, initial encounter: Secondary | ICD-10-CM | POA: Diagnosis not present

## 2022-01-27 HISTORY — PX: KNEE ARTHROSCOPY WITH MEDIAL MENISECTOMY: SHX5651

## 2022-01-27 SURGERY — ARTHROSCOPY, KNEE, WITH MEDIAL MENISCECTOMY
Anesthesia: General | Site: Knee | Laterality: Left

## 2022-01-27 MED ORDER — ONDANSETRON HCL 4 MG/2ML IJ SOLN
INTRAMUSCULAR | Status: AC
  Filled 2022-01-27: qty 2

## 2022-01-27 MED ORDER — ONDANSETRON HCL 4 MG/2ML IJ SOLN: 4.0000 mg | Freq: Four times a day (QID) | INTRAMUSCULAR | Status: DC | PRN

## 2022-01-27 MED ORDER — PROPOFOL 10 MG/ML IV BOLUS
INTRAVENOUS | Status: AC
  Filled 2022-01-27: qty 20

## 2022-01-27 MED ORDER — CEFAZOLIN SODIUM-DEXTROSE 2-4 GM/100ML-% IV SOLN
2.0000 g | INTRAVENOUS | Status: AC
  Administered 2022-01-27: 2 g via INTRAVENOUS
  Filled 2022-01-27: qty 100

## 2022-01-27 MED ORDER — SODIUM CHLORIDE 0.9 % IR SOLN
Status: DC | PRN
  Administered 2022-01-27 (×2): 3000 mL

## 2022-01-27 MED ORDER — METHOCARBAMOL 500 MG PO TABS: 500.0000 mg | ORAL_TABLET | Freq: Four times a day (QID) | ORAL | Status: DC | PRN

## 2022-01-27 MED ORDER — DEXAMETHASONE SODIUM PHOSPHATE 10 MG/ML IJ SOLN
INTRAMUSCULAR | Status: DC | PRN
  Administered 2022-01-27: 10 mg via INTRAVENOUS

## 2022-01-27 MED ORDER — PROPOFOL 10 MG/ML IV BOLUS
INTRAVENOUS | Status: DC | PRN
  Administered 2022-01-27: 170 mg via INTRAVENOUS

## 2022-01-27 MED ORDER — HYDROMORPHONE HCL 1 MG/ML IJ SOLN: 0.5000 mg | INTRAMUSCULAR | Status: DC | PRN

## 2022-01-27 MED ORDER — LIDOCAINE-EPINEPHRINE (PF) 1 %-1:200000 IJ SOLN
INTRAMUSCULAR | Status: AC
  Filled 2022-01-27: qty 30

## 2022-01-27 MED ORDER — AMISULPRIDE (ANTIEMETIC) 5 MG/2ML IV SOLN: 10.0000 mg | Freq: Once | INTRAVENOUS | Status: DC | PRN

## 2022-01-27 MED ORDER — KETOROLAC TROMETHAMINE 30 MG/ML IJ SOLN
INTRAMUSCULAR | Status: AC
  Filled 2022-01-27: qty 1

## 2022-01-27 MED ORDER — ONDANSETRON HCL 4 MG/2ML IJ SOLN
INTRAMUSCULAR | Status: DC | PRN
  Administered 2022-01-27: 4 mg via INTRAVENOUS

## 2022-01-27 MED ORDER — MIDAZOLAM HCL 5 MG/5ML IJ SOLN
INTRAMUSCULAR | Status: DC | PRN
  Administered 2022-01-27: 2 mg via INTRAVENOUS

## 2022-01-27 MED ORDER — FENTANYL CITRATE (PF) 250 MCG/5ML IJ SOLN
INTRAMUSCULAR | Status: DC | PRN
  Administered 2022-01-27 (×4): 50 ug via INTRAVENOUS

## 2022-01-27 MED ORDER — DEXAMETHASONE SODIUM PHOSPHATE 10 MG/ML IJ SOLN
INTRAMUSCULAR | Status: AC
  Filled 2022-01-27: qty 1

## 2022-01-27 MED ORDER — MIDAZOLAM HCL 2 MG/2ML IJ SOLN
INTRAMUSCULAR | Status: AC
  Filled 2022-01-27: qty 2

## 2022-01-27 MED ORDER — LIDOCAINE HCL (PF) 2 % IJ SOLN
INTRAMUSCULAR | Status: AC
  Filled 2022-01-27: qty 5

## 2022-01-27 MED ORDER — 0.9 % SODIUM CHLORIDE (POUR BTL) OPTIME
TOPICAL | Status: DC | PRN
  Administered 2022-01-27: 1000 mL

## 2022-01-27 MED ORDER — LACTATED RINGERS IV SOLN: INTRAVENOUS | Status: DC

## 2022-01-27 MED ORDER — CEFAZOLIN SODIUM-DEXTROSE 2-4 GM/100ML-% IV SOLN: 2.0000 g | Freq: Four times a day (QID) | INTRAVENOUS | Status: DC

## 2022-01-27 MED ORDER — OXYCODONE HCL 5 MG PO TABS: 5.0000 mg | ORAL_TABLET | Freq: Once | ORAL | Status: DC | PRN

## 2022-01-27 MED ORDER — ACETAMINOPHEN 500 MG PO TABS: 1000.0000 mg | ORAL_TABLET | Freq: Four times a day (QID) | ORAL | Status: DC

## 2022-01-27 MED ORDER — ONDANSETRON HCL 4 MG PO TABS: 4.0000 mg | ORAL_TABLET | Freq: Four times a day (QID) | ORAL | Status: DC | PRN

## 2022-01-27 MED ORDER — METHOCARBAMOL 500 MG IVPB - SIMPLE MED: 500.0000 mg | Freq: Four times a day (QID) | INTRAVENOUS | Status: DC | PRN

## 2022-01-27 MED ORDER — FENTANYL CITRATE (PF) 250 MCG/5ML IJ SOLN
INTRAMUSCULAR | Status: AC
  Filled 2022-01-27: qty 5

## 2022-01-27 MED ORDER — FENTANYL CITRATE PF 50 MCG/ML IJ SOSY: 25.0000 ug | PREFILLED_SYRINGE | INTRAMUSCULAR | Status: DC | PRN

## 2022-01-27 MED ORDER — ACETAMINOPHEN 325 MG PO TABS: 325.0000 mg | ORAL_TABLET | Freq: Four times a day (QID) | ORAL | Status: DC | PRN

## 2022-01-27 MED ORDER — LACTATED RINGERS IV BOLUS: 500.0000 mL | Freq: Once | INTRAVENOUS | Status: DC

## 2022-01-27 MED ORDER — LIDOCAINE-EPINEPHRINE 1 %-1:100000 IJ SOLN
INTRAMUSCULAR | Status: DC | PRN
  Administered 2022-01-27: 25 mL

## 2022-01-27 MED ORDER — METOCLOPRAMIDE HCL 5 MG PO TABS
5.0000 mg | ORAL_TABLET | Freq: Three times a day (TID) | ORAL | Status: DC | PRN
  Filled 2022-01-27: qty 2

## 2022-01-27 MED ORDER — OXYCODONE HCL 5 MG/5ML PO SOLN: 5.0000 mg | Freq: Once | ORAL | Status: DC | PRN

## 2022-01-27 MED ORDER — ORAL CARE MOUTH RINSE: 15.0000 mL | Freq: Once | OROMUCOSAL | Status: AC

## 2022-01-27 MED ORDER — ACETAMINOPHEN 500 MG PO TABS
1000.0000 mg | ORAL_TABLET | Freq: Once | ORAL | Status: AC
  Administered 2022-01-27: 1000 mg via ORAL
  Filled 2022-01-27: qty 2

## 2022-01-27 MED ORDER — PROPOFOL 1000 MG/100ML IV EMUL
INTRAVENOUS | Status: AC
  Filled 2022-01-27: qty 100

## 2022-01-27 MED ORDER — METOCLOPRAMIDE HCL 5 MG/ML IJ SOLN: 5.0000 mg | Freq: Three times a day (TID) | INTRAMUSCULAR | Status: DC | PRN

## 2022-01-27 MED ORDER — EPHEDRINE 5 MG/ML INJ
INTRAVENOUS | Status: AC
  Filled 2022-01-27: qty 5

## 2022-01-27 MED ORDER — OXYCODONE HCL 5 MG PO TABS: 5.0000 mg | ORAL_TABLET | ORAL | Status: DC | PRN

## 2022-01-27 MED ORDER — ROPIVACAINE HCL 5 MG/ML IJ SOLN
INTRAMUSCULAR | Status: AC
  Filled 2022-01-27: qty 30

## 2022-01-27 MED ORDER — ROPIVACAINE HCL 5 MG/ML IJ SOLN
INTRAMUSCULAR | Status: DC | PRN
  Administered 2022-01-27: 20 mL

## 2022-01-27 MED ORDER — EPHEDRINE SULFATE-NACL 50-0.9 MG/10ML-% IV SOSY
PREFILLED_SYRINGE | INTRAVENOUS | Status: DC | PRN
  Administered 2022-01-27 (×2): 5 mg via INTRAVENOUS

## 2022-01-27 MED ORDER — KETOROLAC TROMETHAMINE 30 MG/ML IJ SOLN
INTRAMUSCULAR | Status: DC | PRN
  Administered 2022-01-27: 30 mg via INTRAVENOUS

## 2022-01-27 MED ORDER — CHLORHEXIDINE GLUCONATE 0.12 % MT SOLN
15.0000 mL | Freq: Once | OROMUCOSAL | Status: AC
  Administered 2022-01-27: 15 mL via OROMUCOSAL

## 2022-01-27 SURGICAL SUPPLY — 32 items
BAG COUNTER SPONGE SURGICOUNT (BAG) ×1 IMPLANT
BAG SPNG CNTER NS LX DISP (BAG) ×1
BNDG CMPR MED 10X6 ELC LF (GAUZE/BANDAGES/DRESSINGS) ×1
BNDG ELASTIC 6X10 VLCR STRL LF (GAUZE/BANDAGES/DRESSINGS) ×1 IMPLANT
BNDG ELASTIC 6X5.8 VLCR STR LF (GAUZE/BANDAGES/DRESSINGS) IMPLANT
DRSG EMULSION OIL 3X3 NADH (GAUZE/BANDAGES/DRESSINGS) ×1 IMPLANT
DURAPREP 26ML APPLICATOR (WOUND CARE) ×1 IMPLANT
EXCALIBUR 3.8MM X 13CM (MISCELLANEOUS) ×1 IMPLANT
GAUZE PAD ABD 8X10 STRL (GAUZE/BANDAGES/DRESSINGS) ×2 IMPLANT
GAUZE SPONGE 4X4 12PLY STRL (GAUZE/BANDAGES/DRESSINGS) ×1 IMPLANT
GAUZE XEROFORM 1X8 LF (GAUZE/BANDAGES/DRESSINGS) ×1 IMPLANT
GLOVE BIOGEL M 7.0 STRL (GLOVE) ×1 IMPLANT
GLOVE BIOGEL PI IND STRL 7.5 (GLOVE) ×1 IMPLANT
GLOVE BIOGEL PI IND STRL 8 (GLOVE) ×1 IMPLANT
GLOVE ECLIPSE 8.0 STRL XLNG CF (GLOVE) ×1 IMPLANT
GLOVE INDICATOR 6.5 STRL GRN (GLOVE) ×1 IMPLANT
GLOVE SURG SS PI 7.0 STRL IVOR (GLOVE) ×1 IMPLANT
GOWN STRL REUS W/ TWL LRG LVL3 (GOWN DISPOSABLE) ×1 IMPLANT
GOWN STRL REUS W/TWL LRG LVL3 (GOWN DISPOSABLE) ×1
KIT BASIN OR (CUSTOM PROCEDURE TRAY) ×1 IMPLANT
KIT TURNOVER KIT A (KITS) ×1 IMPLANT
LEGGING LITHOTOMY PAIR STRL (DRAPES) ×1 IMPLANT
MANIFOLD NEPTUNE II (INSTRUMENTS) ×1 IMPLANT
PACK ARTHROSCOPY DSU (CUSTOM PROCEDURE TRAY) ×1 IMPLANT
PAD MASON LEG HOLDER (PIN) ×1 IMPLANT
PADDING CAST ABS COTTON 6X4 NS (CAST SUPPLIES) ×1 IMPLANT
PADDING CAST COTTON 6X4 STRL (CAST SUPPLIES) IMPLANT
PORT APPOLLO RF 90DEGREE MULTI (SURGICAL WAND) IMPLANT
SUT ETHILON 4 0 PS 2 18 (SUTURE) ×1 IMPLANT
SYR 20ML LL LF (SYRINGE) ×1 IMPLANT
TOWEL OR 17X26 10 PK STRL BLUE (TOWEL DISPOSABLE) ×2 IMPLANT
TUBING ARTHROSCOPY IRRIG 16FT (MISCELLANEOUS) ×1 IMPLANT

## 2022-01-27 NOTE — Op Note (Signed)
NAME: Sabrina Harper, [facility] MEDICAL RECORD NO: [id number] ACCOUNT NO: [id number] DATE OF BIRTH: [birth year] FACILITY: [facility] LOCATION: [facility] PHYSICIAN: Pietro Cassis. Alvan Dame, MD  Operative Report   DATE OF PROCEDURE: 01/27/2022  PREOPERATIVE DIAGNOSIS:  Left knee medial meniscal tear associated with chondromalacia.  POSTOPERATIVE DIAGNOSES/FINDINGS: 1.  Complex tear to the medial meniscus from posterior horn all the way to the anterior mid body junction with large flap component and loose fragment. 2.  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  PROCEDURE: 1.  Diagnostic and operative arthroscopy with partial medial meniscectomy, removing probably 30-40% of the meniscus. 2.  Medial and patellofemoral chondroplasty, debridement.  SURGEON:  Pietro Cassis. Alvan Dame, MD  ASSISTANT:  Surgical team.  ANESTHESIA:  Local plus general.  COMPLICATIONS:  None.  DRAINS:  None.  BLOOD LOSS:  Minimal.  INDICATIONS FOR THE PROCEDURE:  The patient is a 66 year old female with known history of medial meniscal tear involving the left knee by prior MRI.  Her symptoms did not respond to conservative treatment including previous cortisone injections,  medications and activity modification.  Given the persistence of her symptoms and mechanical nature of her symptoms, we elected to proceed with arthroscopic surgery based on her exam findings as well as radiographic findings and had indicated relative  preservation of joint space despite MRI findings of chondromalacia.  I reviewed the risks of infection and DVT as being fairly minimal.  There would be a risk of recurrent meniscal pathology as well as progression of osteoarthritis that may necessitate  further definitive treatment in the form of arthroplasty.  Consent was obtained for benefit of pain relief.  PROCEDURE IN DETAIL:  The patient was brought to the operative theater.  Once adequate anesthesia was established, 2 grams of  Ancef administered, she was positioned supine with her left leg in leg holder.  The left lower extremity was then prepped and  draped in sterile fashion.  A timeout was performed identifying the patient, planned procedure, and extremity.  Two-portal technique was utilized.  The inferior lateral portal was used for the arthroscopic camera.  Diagnostic evaluation of knee revealed  grade III chondromalacia, primarily involving the undersurface of the patella on the lateral facet region.  Mild grade II change within the trochlea.  Within the medial compartment, the complex medial meniscal tear was readily evident upon entry into the  compartment.  The inferomedial portal was opened as a working portal.  I used a 3.8 arthroscopic shaver and was able to perform a significant portion of the partial medial meniscectomy.  Identified a large flap tear in the anterior mid body region,  which was debrided back.  The meniscus tear went all the way from the posterior horn all the way up to the anterior mid body junction.  There was some punctate bleeding at the junction of the meniscocapsular region for which I did end up opening up an  arthroscopic wand to cauterize this area.  I used the 3.8 shaver also to perform chondroplasty of the medial compartment, debriding unstable flaps of cartilage.  There was evidence of grade III changes, but no evidence of exposed bone at this point. I  did use a straight biting basket to perform partial meniscectomy posteriorly as well and then followed this up with the shaver to remove meniscal fragments and contour the remaining meniscus into a stable level.  The anterior aspect of the knee was  addressed noting that the anterior cruciate ligament appeared to be intact with some degenerative  changes noted.  The lateral compartment was relatively normal with no procedures necessary.  Within the patellofemoral compartment, I used both portals.   First, the inferior medial and then inferior  lateral portal to perform chondroplasty on the undersurface of the patella, mainly on the lateral facet, debriding the chondral flaps back to a stable level.  Again, I did not see any exposed bone.  Once I  completed these procedures, I reexamined the knee to make certain there were no loose fragments of cartilage and then I was satisfied with the articular and meniscal surfaces and the stability.  The knee was irrigated and instrumentation was removed.   The portal sites were then reapproximated using 4-0 nylon.  I injected the knee at the end of the case with 0.2% ropivacaine.  The knee was wrapped into a sterile bulky Jones dressing.  She was brought to the recovery room in stable condition, tolerating  the procedure well.  We will see her back in the office in 2 weeks to review findings and remove sutures.   SHW D: 01/27/2022 8:20:13 am T: 01/27/2022 10:03:00 am  JOB: [id number]/ [id number]

## 2022-01-27 NOTE — Anesthesia Procedure Notes (Signed)
Procedure Name: LMA Insertion Date/Time: 01/27/2022 7:18 AM  Performed by: Jenne Campus, CRNAPre-anesthesia Checklist: Patient identified, Emergency Drugs available, Suction available and Patient being monitored Patient Re-evaluated:Patient Re-evaluated prior to induction Oxygen Delivery Method: Circle System Utilized Preoxygenation: Pre-oxygenation with 100% oxygen Induction Type: IV induction Ventilation: Mask ventilation without difficulty LMA: LMA inserted LMA Size: 4.0 Number of attempts: 1 Airway Equipment and Method: Bite block Placement Confirmation: positive ETCO2 and breath sounds checked- equal and bilateral Tube secured with: Tape Dental Injury: Teeth and Oropharynx as per pre-operative assessment

## 2022-01-27 NOTE — Brief Op Note (Signed)
01/27/2022  8:11 AM  PATIENT:  Sabrina Harper  66 y.o. female  PRE-OPERATIVE DIAGNOSIS:  1. Left knee complex medial meniscus tear, 2. 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 Left knee complex medial meniscus tear, 2. Grade III chondromalacia of medial compartment and patellofemoral compartment  PROCEDURE:  Procedure(s): KNEE ARTHROSCOPY WITH 1. Partial MEDIAL MENISECTOMY, 2. medial condoplasty and patellofemoral chondroplasty(Left)  SURGEON:  Surgeon(s) and Role:    * Paralee Cancel, MD - Primary  PHYSICIAN ASSISTANT: None  ANESTHESIA:   local and general  EBL:  Minimal  BLOOD ADMINISTERED:none  DRAINS: none   LOCAL MEDICATIONS USED:  20 cc .2% Ropivacaine   SPECIMEN:  No Specimen  DISPOSITION OF SPECIMEN:  N/A  COUNTS:  YES  TOURNIQUET:  * No tourniquets in log *  DICTATION: .Other Dictation: Dictation Number [id number]  PLAN OF CARE: Discharge to home after PACU  PATIENT DISPOSITION:  PACU - hemodynamically stable.   Delay start of Pharmacological VTE agent (>24hrs) due to surgical blood loss or risk of bleeding: no

## 2022-01-27 NOTE — Transfer of Care (Signed)
Immediate Anesthesia Transfer of Care Note  Patient: Sabrina Harper [facility]  Procedure(s) Performed: KNEE ARTHROSCOPY WITH MEDIAL MENISECTOMY, medial condoplasty (Left: Knee)  Patient Location: PACU  Anesthesia Type:General  Level of Consciousness: oriented, drowsy, and patient cooperative  Airway & Oxygen Therapy: Patient Spontanous Breathing and Patient connected to nasal cannula oxygen  Post-op Assessment: Report given to RN and Post -op Vital signs reviewed and stable  Post vital signs: Reviewed  Last Vitals:  Vitals Value Taken Time  BP 119/76 01/27/22 0819  Temp    Pulse 83 01/27/22 0821  Resp 11 01/27/22 0821  SpO2 99 % 01/27/22 0821  Vitals shown include unvalidated device data.  Last Pain:  Vitals:   01/27/22 0542  PainSc: 3       Patients Stated Pain Goal: 3 (98/10/25 4862)  Complications: No notable events documented.

## 2022-01-27 NOTE — Anesthesia Postprocedure Evaluation (Signed)
Anesthesia Post Note  Patient: Sabrina Harper [facility]  Procedure(s) Performed: KNEE ARTHROSCOPY WITH MEDIAL MENISECTOMY, medial condoplasty (Left: Knee)     Patient location during evaluation: PACU Anesthesia Type: General Level of consciousness: awake and alert Pain management: pain level controlled Vital Signs Assessment: post-procedure vital signs reviewed and stable Respiratory status: spontaneous breathing, nonlabored ventilation, respiratory function stable and patient connected to nasal cannula oxygen Cardiovascular status: blood pressure returned to baseline and stable Postop Assessment: no apparent nausea or vomiting Anesthetic complications: no  No notable events documented.  Last Vitals:  Vitals:   01/27/22 0900 01/27/22 0945  BP: 131/78 126/75  Pulse: 76 76  Resp: 10   Temp:  36.6 C  SpO2: 100% 99%    Last Pain:  Vitals:   01/27/22 0820  PainSc: 0-No pain                 Tiajuana Amass

## 2022-01-27 NOTE — Anesthesia Preprocedure Evaluation (Addendum)
Anesthesia Evaluation  Patient identified by MRN, date of birth, ID band Patient awake    Reviewed: Allergy & Precautions, NPO status , Patient's Chart, lab work & pertinent test results  Airway Mallampati: II  TM Distance: >3 FB Neck ROM: Full    Dental  (+) Teeth Intact, Dental Advisory Given   Pulmonary neg pulmonary ROS   breath sounds clear to auscultation       Cardiovascular negative cardio ROS  Rhythm:Regular Rate:Normal     Neuro/Psych negative neurological ROS     GI/Hepatic Neg liver ROS,GERD  ,,  Endo/Other  negative endocrine ROS    Renal/GU negative Renal ROS     Musculoskeletal  (+) Arthritis ,    Abdominal   Peds  Hematology negative hematology ROS (+)   Anesthesia Other Findings   Reproductive/Obstetrics                              Anesthesia Physical Anesthesia Plan  ASA: 2  Anesthesia Plan: General   Post-op Pain Management: Tylenol PO (pre-op)* and Toradol IV (intra-op)*   Induction: Intravenous  PONV Risk Score and Plan: 3 and Dexamethasone, Ondansetron, Midazolam and Treatment may vary due to age or medical condition  Airway Management Planned: LMA  Additional Equipment:   Intra-op Plan:   Post-operative Plan: Extubation in OR  Informed Consent: I have reviewed the patients History and Physical, chart, labs and discussed the procedure including the risks, benefits and alternatives for the proposed anesthesia with the patient or authorized representative who has indicated his/her understanding and acceptance.     Dental advisory given  Plan Discussed with: CRNA  Anesthesia Plan Comments:         Anesthesia Quick Evaluation

## 2022-01-27 NOTE — Discharge Instructions (Signed)
INSTRUCTIONS AFTER SURGERY  Remove items at home which could result in a fall. This includes throw rugs or furniture in walking pathways ICE to the affected joint every three hours while awake for 30 minutes at a time, for at least the first 3-5 days, and then as needed for pain and swelling.  Continue to use ice for pain and swelling. You may notice swelling that will progress down to the foot and ankle.  This is normal after surgery.  Elevate your leg when you are not up walking on it.   Continue to use the breathing machine you got in the hospital (incentive spirometer) which will help keep your temperature down.  It is common for your temperature to cycle up and down following surgery, especially at night when you are not up moving around and exerting yourself.  The breathing machine keeps your lungs expanded and your temperature down.   DIET:  As you were doing prior to hospitalization, we recommend a well-balanced diet.  DRESSING / WOUND CARE / SHOWERING  You may shower 3 days after surgery, but keep the wounds dry during showering.  You may use an occlusive plastic wrap (Press'n Seal for example), NO SOAKING/SUBMERGING IN THE BATHTUB.  If the bandage gets wet, change with a clean dry gauze.  If the incision gets wet, pat the wound dry with a clean towel.  ACTIVITY  Increase activity slowly as tolerated, but follow the weight bearing instructions below.   No driving for 6 weeks or until further direction given by your physician.  You cannot drive while taking narcotics.  No lifting or carrying greater than 10 lbs. until further directed by your surgeon. Avoid periods of inactivity such as sitting longer than an hour when not asleep. This helps prevent blood clots.  You may return to work once you are authorized by your doctor.     WEIGHT BEARING   Weight bearing as tolerated with assist device (walker, cane, etc) as directed, use it as long as suggested by your surgeon or therapist,  typically at least 4-6 weeks.  CONSTIPATION  Constipation is defined medically as fewer than three stools per week and severe constipation as less than one stool per week.  Even if you have a regular bowel pattern at home, your normal regimen is likely to be disrupted due to multiple reasons following surgery.  Combination of anesthesia, postoperative narcotics, change in appetite and fluid intake all can affect your bowels.   YOU MUST use at least one of the following options; they are listed in order of increasing strength to get the job done.  They are all available over the counter, and you may need to use some, POSSIBLY even all of these options:    Drink plenty of fluids (prune juice may be helpful) and high fiber foods Colace 100 mg by mouth twice a day  Senokot for constipation as directed and as needed Dulcolax (bisacodyl), take with full glass of water  Miralax (polyethylene glycol) once or twice a day as needed.  If you have tried all these things and are unable to have a bowel movement in the first 3-4 days after surgery call either your surgeon or your primary doctor.    If you experience loose stools or diarrhea, hold the medications until you stool forms back up.  If your symptoms do not get better within 1 week or if they get worse, check with your doctor.  If you experience "the worst abdominal pain ever"  or develop nausea or vomiting, please contact the office immediately for further recommendations for treatment.   ITCHING:  If you experience itching with your medications, try taking only a single pain pill, or even half a pain pill at a time.  You can also use Benadryl over the counter for itching or also to help with sleep.   TED HOSE STOCKINGS:  Use stockings on both legs until for at least 2 weeks or as directed by physician office. They may be removed at night for sleeping.  MEDICATIONS:  See your medication summary on the "After Visit Summary" that nursing will review  with you.  You may have some home medications which will be placed on hold until you complete the course of blood thinner medication.  It is important for you to complete the blood thinner medication as prescribed.  PRECAUTIONS:  If you experience chest pain or shortness of breath - call 911 immediately for transfer to the hospital emergency department.   If you develop a fever greater that 101 F, purulent drainage from wound, increased redness or drainage from wound, foul odor from the wound/dressing, or calf pain - CONTACT YOUR SURGEON.                                                   FOLLOW-UP APPOINTMENTS:  If you do not already have a post-op appointment, please call the office for an appointment to be seen by your surgeon.  Guidelines for how soon to be seen are listed in your "After Visit Summary", but are typically between 1-4 weeks after surgery.  POST-OPERATIVE OPIOID TAPER INSTRUCTIONS: It is important to wean off of your opioid medication as soon as possible. If you do not need pain medication after your surgery it is ok to stop day one. Opioids include: Codeine, Hydrocodone(Norco, Vicodin), Oxycodone(Percocet, oxycontin) and hydromorphone amongst others.  Long term and even short term use of opiods can cause: Increased pain response Dependence Constipation Depression Respiratory depression And more.  Withdrawal symptoms can include Flu like symptoms Nausea, vomiting And more Techniques to manage these symptoms Hydrate well Eat regular healthy meals Stay active Use relaxation techniques(deep breathing, meditating, yoga) Do Not substitute Alcohol to help with tapering If you have been on opioids for less than two weeks and do not have pain than it is ok to stop all together.  Plan to wean off of opioids This plan should start within one week post op of your joint replacement. Maintain the same interval or time between taking each dose and first decrease the dose.  Cut the  total daily intake of opioids by one tablet each day Next start to increase the time between doses. The last dose that should be eliminated is the evening dose.   MAKE SURE YOU:  Understand these instructions.  Get help right away if you are not doing well or get worse.    Thank you for letting us be a part of your medical care team.  It is a privilege we respect greatly.  We hope these instructions will help you stay on track for a fast and full recovery!

## 2022-01-27 NOTE — H&P (Signed)
CC- Sabrina Harper is a 66 y.o. female who presents with left knee pain.  HPI- .  Pleasant 66 year old female presented to the office for evaluation and management of left knee pain.  She presented with medial joint line pain.  Conservative treatment including medications, activity modification as well as past cortisone injections had failed to provide benefit.  She has been dealing with this for greater than a year.  She is now ready to have it addressed.  Past Medical History:  Diagnosis Date   Arthritis    CIN II (cervical intraepithelial neoplasia II) 1994   Family history of adverse reaction to anesthesia    Mom nausea   GERD (gastroesophageal reflux disease)    Hx of adenomatous colonic polyps 07/17/2020   2 diminutive adenomas   Ringing of ears    Skin cancer    Eyelid    Past Surgical History:  Procedure Laterality Date   AUGMENTATION MAMMAPLASTY  2002   -saline   CERVICAL BIOPSY  W/ LOOP ELECTRODE EXCISION  1994   CIN II   CERVICAL CONE BIOPSY  1994   CESAREAN SECTION     x2, 1982, 1988   COLONOSCOPY  12/30/2006   [facility]    HYSTEROSCOPY  2000   resection of polyp   KNEE ARTHROSCOPY Right 2006   SKIN CANCER EXCISION     Eyelid   TUBAL LIGATION      Prior to Admission medications   Medication Sig Start Date End Date Taking? Authorizing Provider  acetaminophen (TYLENOL) 500 MG tablet Take 500 mg by mouth every 8 (eight) hours as needed for moderate pain.   Yes [provider]  Calcium Carb-Cholecalciferol (CALCIUM 500 + D3 PO) Take 2 each by mouth daily. Chewables   Yes [provider]  EPINEPHrine 0.3 mg/0.3 mL IJ SOAJ injection Inject 0.3 mg into the muscle once as needed (anaphylaxis/allergic reaction). 06/12/21  Yes Tawnya Crook, MD  ibuprofen (ADVIL) 200 MG tablet Take 600 mg by mouth every 8 (eight) hours as needed for moderate pain.   Yes [provider]  Multiple Vitamin (MULTIVITAMINS PO) Take 2 each by mouth daily. 2 gummies    Yes [provider]  fluticasone (FLONASE) 50 MCG/ACT nasal spray Place 1 spray into both nostrils daily as needed for allergies ([facility]).    [provider]    soft tissue tenderness over medial joint line, negative drawer sign, negative pivot-shift, collateral ligaments intact, negative Lachman sign, normal ipsilateral hip exam, normal contralateral knee exam  Physical Examination: General appearance - alert, well appearing, and in no distress Mental status - alert, oriented to person, place, and time Chest - no tachypnea, retractions or cyanosis Heart - normal rate and regular rhythm Abdomen - soft, nontender, nondistended, no masses or organomegaly Neurological - alert, oriented, normal speech, no focal findings or movement disorder noted Musculoskeletal - see above findings Extremities - peripheral pulses normal, no pedal edema, no clubbing or cyanosis Skin - normal coloration and turgor, no rashes, no suspicious skin lesions noted  Assessment: 1.  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  Plan: Patient presented to the office for follow-up evaluation and management of her left knee.  Radiographs had indicated relative preservation of joint space.  Previously performed MRI of her left knee had indicated medial meniscal tear.  Given the persistence of her symptoms correlate with these exam and radiographic  findings she at this point was ready to proceed with surgery.  We discussed managing her meniscus tear arthroscopically.  We discussed the limitations in managing osteoarthritis and the potential for recurrent meniscal pathology and/or the progression of osteoarthritis.  There are minimal risk for infection and DVT.  She elects to proceed.  Consent was obtained for the benefit of pain relief.

## 2022-01-27 NOTE — Interval H&P Note (Signed)
History and Physical Interval Note:  01/27/2022 6:52 AM  Sabrina Harper  has presented today for surgery, with the diagnosis of Left knee medial meniscus tear.  The various methods of treatment have been discussed with the patient and family. After consideration of risks, benefits and other options for treatment, the patient has consented to  Procedure(s): KNEE ARTHROSCOPY WITH MEDIAL MENISECTOMY (Left) as a surgical intervention.  The patient's history has been reviewed, patient examined, no change in status, stable for surgery.  I have reviewed the patient's chart and labs.  Questions were answered to the patient's satisfaction.     [name]

## 2022-01-27 NOTE — Progress Notes (Signed)
Orthopedic Tech Progress Note Patient Details:  Sabrina Harper [facility] Dec 16, 1955 [id number]  Patient ID: [name], female   DOB: Jan 28, 1956, 66 y.o.   MRN: [id number]  [name] 01/27/2022, 9:04 AM Crutches adjusted and delivered to pacu per RN verbal order

## 2022-01-28 ENCOUNTER — Encounter (HOSPITAL_COMMUNITY): Payer: Self-pay | Admitting: Orthopedic Surgery

## 2022-02-19 ENCOUNTER — Encounter: Payer: Self-pay | Admitting: *Deleted

## 2022-03-12 DIAGNOSIS — Z4789 Encounter for other orthopedic aftercare: Secondary | ICD-10-CM | POA: Diagnosis not present

## 2022-03-12 DIAGNOSIS — M25562 Pain in left knee: Secondary | ICD-10-CM | POA: Diagnosis not present

## 2022-03-26 DIAGNOSIS — M25562 Pain in left knee: Secondary | ICD-10-CM | POA: Diagnosis not present

## 2022-03-30 DIAGNOSIS — M25562 Pain in left knee: Secondary | ICD-10-CM | POA: Diagnosis not present

## 2022-04-03 DIAGNOSIS — M25562 Pain in left knee: Secondary | ICD-10-CM | POA: Diagnosis not present

## 2022-04-07 DIAGNOSIS — M25562 Pain in left knee: Secondary | ICD-10-CM | POA: Diagnosis not present

## 2022-04-08 DIAGNOSIS — M25562 Pain in left knee: Secondary | ICD-10-CM | POA: Diagnosis not present

## 2022-04-08 DIAGNOSIS — M1712 Unilateral primary osteoarthritis, left knee: Secondary | ICD-10-CM | POA: Diagnosis not present

## 2022-04-30 ENCOUNTER — Telehealth: Payer: Self-pay | Admitting: Family Medicine

## 2022-04-30 DIAGNOSIS — M25562 Pain in left knee: Secondary | ICD-10-CM | POA: Diagnosis not present

## 2022-04-30 NOTE — Telephone Encounter (Signed)
FYI

## 2022-04-30 NOTE — Telephone Encounter (Signed)
Patient called to schedule surgical clearance completed prior to total left knee replacement on 06/02/22. States Dr. Aurea Graff office, [facility], should be sending the paperwork via fax. She is scheduled for 05/14/22 @ 10:30am.

## 2022-05-04 DIAGNOSIS — M1712 Unilateral primary osteoarthritis, left knee: Secondary | ICD-10-CM | POA: Diagnosis not present

## 2022-05-14 ENCOUNTER — Ambulatory Visit: Payer: No Typology Code available for payment source | Admitting: Family Medicine

## 2022-05-14 ENCOUNTER — Encounter: Payer: Self-pay | Admitting: Family Medicine

## 2022-05-14 VITALS — BP 130/84 | HR 68 | Temp 98.3°F | Ht 69.0 in | Wt 175.0 lb

## 2022-05-14 DIAGNOSIS — M879 Osteonecrosis, unspecified: Secondary | ICD-10-CM | POA: Diagnosis not present

## 2022-05-14 NOTE — Progress Notes (Signed)
Subjective:     Patient ID: Sabrina Harper, female    DOB: [birth year], 67 y.o.   MRN: [id number]  Chief Complaint  Patient presents with   Surgical Clearance    Surgical clearance for total left knee replacement on 06/02/22 Not fasting     HPI Surg clearance for L knee 06/02/22-had fallen in 2022(dog pulled her).  Had arthroscopy, cortisone,HA.  Dr. Alvan Dame did arth in Nov 2023-then pain again.  Seen in Jan.  PT, worse.  SONC(spont osteonecrosis)-so sch for replacement No cp/palp/sob.  Can walk a  flight of stairs  Health Maintenance Due  Topic Date Due   Medicare Annual Wellness (AWV)  Never done   PAP SMEAR-Modifier  04/04/2022    Past Medical History:  Diagnosis Date   Arthritis    CIN II (cervical intraepithelial neoplasia II) 1994   Family history of adverse reaction to anesthesia    Mom nausea   GERD (gastroesophageal reflux disease)    Hx of adenomatous colonic polyps 07/17/2020   2 diminutive adenomas   Ringing of ears    Skin cancer    Eyelid    Past Surgical History:  Procedure Laterality Date   AUGMENTATION MAMMAPLASTY  2002   -saline   CERVICAL BIOPSY  W/ LOOP ELECTRODE EXCISION  1994   CIN II   CERVICAL CONE BIOPSY  1994   CESAREAN SECTION     x2, 1982, 1988   COLONOSCOPY  12/30/2006   [facility]    HYSTEROSCOPY  2000   resection of polyp   KNEE ARTHROSCOPY Right 2006   KNEE ARTHROSCOPY WITH MEDIAL MENISECTOMY Left 01/27/2022   Procedure: KNEE ARTHROSCOPY WITH MEDIAL MENISECTOMY, medial condoplasty;  Surgeon: Paralee Cancel, MD;  Location: [facility];  Service: Orthopedics;  Laterality: Left;   SKIN CANCER EXCISION     Eyelid   TUBAL LIGATION      Outpatient Medications Prior to Visit  Medication Sig Dispense Refill   acetaminophen (TYLENOL) 500 MG tablet Take 500 mg by mouth every 8 (eight) hours as needed for moderate pain.     Calcium Carb-Cholecalciferol (CALCIUM 500 + D3 PO) Take 2 each by mouth daily. Chewables     diclofenac (VOLTAREN) 75 MG  EC tablet TAKE 1 TABLET TWICE A DAY BY ORAL ROUTE WITH MEAL(S).     EPINEPHrine 0.3 mg/0.3 mL IJ SOAJ injection Inject 0.3 mg into the muscle once as needed (anaphylaxis/allergic reaction). 1 each 1   fluticasone (FLONASE) 50 MCG/ACT nasal spray Place 1 spray into both nostrils daily as needed for allergies ([facility]).     Multiple Vitamin (MULTIVITAMINS PO) Take 2 each by mouth daily. 2 gummies     ibuprofen (ADVIL) 200 MG tablet Take 600 mg by mouth every 8 (eight) hours as needed for moderate pain.     traMADol (ULTRAM) 50 MG tablet Take 50 mg by mouth every 6 (six) hours. (Patient not taking: Reported on 05/14/2022)     No facility-administered medications prior to visit.    Allergies  Allergen Reactions   Other Anaphylaxis    Wheat straw   Black Cohosh     Headaches    Nitrofurantoin Hives and Other (See Comments)   ROS neg/noncontributory except as noted HPI/below tinnitis      Objective:     BP 130/84   Pulse 68   Temp 98.3 F (36.8 C) (Temporal)   Ht '5\' 9"'$  (1.753 m)   Wt 175 lb (79.4 kg)   LMP 03/09/2004  SpO2 98%   BMI 25.84 kg/m  Wt Readings from Last 3 Encounters:  05/14/22 175 lb (79.4 kg)  01/27/22 166 lb 3.2 oz (75.4 kg)  01/15/22 166 lb 3.2 oz (75.4 kg)    Physical Exam   Gen: WDWN NAD HEENT: NCAT, conjunctiva not injected, sclera nonicteric NECK:  supple, no thyromegaly, no nodes, no carotid bruits CARDIAC: RRR, S1S2+, no murmur. DP 2+B LUNGS: CTAB. No wheezes ABDOMEN:  BS+, soft, NTND, No HSM, no masses EXT:  no edema MSK: cane NEURO: A&O x3.  CN II-XII intact.  PSYCH: normal mood. Good eye contact     Assessment & Plan:   Problem List Items Addressed This Visit   None Visit Diagnoses     Osteonecrosis of left knee region ([place])    -  Primary      Osteonectosis L knee-needs clearance for surgery.  Low risk for surgery.  Form filled out  No orders of the defined types were placed in this encounter.   Wellington Hampshire, MD

## 2022-05-15 NOTE — Progress Notes (Signed)
Sent message, via epic in basket, requesting orders in epic from surgeon.  

## 2022-05-18 DIAGNOSIS — M25662 Stiffness of left knee, not elsewhere classified: Secondary | ICD-10-CM | POA: Diagnosis not present

## 2022-05-18 DIAGNOSIS — M1712 Unilateral primary osteoarthritis, left knee: Secondary | ICD-10-CM | POA: Diagnosis not present

## 2022-05-18 DIAGNOSIS — M25562 Pain in left knee: Secondary | ICD-10-CM | POA: Diagnosis not present

## 2022-05-21 NOTE — Patient Instructions (Signed)
DUE TO COVID-19 ONLY TWO VISITORS  (aged 66 and older)  ARE ALLOWED TO COME WITH YOU AND STAY IN THE WAITING ROOM ONLY DURING PRE OP AND PROCEDURE.   **NO VISITORS ARE ALLOWED IN THE SHORT STAY AREA OR RECOVERY ROOM!!**  IF YOU WILL BE ADMITTED INTO THE HOSPITAL YOU ARE ALLOWED ONLY FOUR SUPPORT PEOPLE DURING VISITATION HOURS ONLY (7 AM -8PM)   The support person(s) must pass our screening, gel in and out, and wear a mask at all times, including in the patient's room. Patients must also wear a mask when staff or their support person are in the room. Visitors GUEST BADGE MUST BE WORN VISIBLY  One adult visitor may remain with you overnight and MUST be in the room by 8 P.M.     Your procedure is scheduled on: 06/02/22   Report to [facility] Main Entrance    Report to admitting at : 7:30 AM   Call this number if you have problems the morning of surgery [phone]   Do not eat food :After Midnight.   After Midnight you may have the following liquids until : 7:00 AM DAY OF SURGERY  Water Black Coffee (sugar ok, NO MILK/CREAM OR CREAMERS)  Tea (sugar ok, NO MILK/CREAM OR CREAMERS) regular and decaf                             Plain Jell-O (NO RED)                                           Fruit ices (not with fruit pulp, NO RED)                                     Popsicles (NO RED)                                                                  Juice: apple, WHITE grape, WHITE cranberry Sports drinks like Gatorade (NO RED)   The day of surgery:  Drink ONE (1) Pre-Surgery Clear Ensure  at : 7:00 AM the morning of surgery. Drink in one sitting. Do not sip.  This drink was given to you during your hospital  pre-op appointment visit. Nothing else to drink after completing the  Pre-Surgery Clear Ensure or G2.          If you have questions, please contact your surgeon's office.     Oral Hygiene is also important to reduce your risk of infection.                                     Remember - BRUSH YOUR TEETH THE MORNING OF SURGERY WITH YOUR REGULAR TOOTHPASTE  DENTURES WILL BE REMOVED PRIOR TO SURGERY PLEASE DO NOT APPLY "Poly grip" OR ADHESIVES!!!   Do NOT smoke after Midnight   Take these medicines the morning of surgery with A SIP OF WATER: N/A  You may not have any metal on your body including hair pins, jewelry, and body piercing             Do not wear make-up, lotions, powders, perfumes/cologne, or deodorant  Do not wear nail polish including gel and S&S, artificial/acrylic nails, or any other type of covering on natural nails including finger and toenails. If you have artificial nails, gel coating, etc. that needs to be removed by a nail salon please have this removed prior to surgery or surgery may need to be canceled/ delayed if the surgeon/ anesthesia feels like they are unable to be safely monitored.   Do not shave  48 hours prior to surgery.               Men may shave face and neck.   Do not bring valuables to the hospital. [place].   Contacts, glasses, or bridgework may not be worn into surgery.   Bring small overnight bag day of surgery.   DO NOT [place]. PHARMACY WILL DISPENSE MEDICATIONS LISTED ON YOUR MEDICATION LIST TO YOU DURING YOUR ADMISSION [place]!    Patients discharged on the day of surgery will not be allowed to drive home.  Someone NEEDS to stay with you for the first 24 hours after anesthesia.   Special Instructions: Bring a copy of your healthcare power of attorney and living will documents         the day of surgery if you haven't scanned them before.              Please read over the following fact sheets you were given: IF YOU HAVE QUESTIONS ABOUT YOUR PRE-OP INSTRUCTIONS PLEASE CALL [phone]    [facility] - Preparing for Surgery Before surgery, you can play an important role.  Because skin  is not sterile, your skin needs to be as free of germs as possible.  You can reduce the number of germs on your skin by washing with CHG (chlorahexidine gluconate) soap before surgery.  CHG is an antiseptic cleaner which kills germs and bonds with the skin to continue killing germs even after washing. Please DO NOT use if you have an allergy to CHG or antibacterial soaps.  If your skin becomes reddened/irritated stop using the CHG and inform your nurse when you arrive at Short Stay. Do not shave (including legs and underarms) for at least 48 hours prior to the first CHG shower.  You may shave your face/neck. Please follow these instructions carefully:  1.  Shower with CHG Soap the night before surgery and the  morning of Surgery.  2.  If you choose to wash your hair, wash your hair first as usual with your  normal  shampoo.  3.  After you shampoo, rinse your hair and body thoroughly to remove the  shampoo.                           4.  Use CHG as you would any other liquid soap.  You can apply chg directly  to the skin and wash                       Gently with a scrungie or clean washcloth.  5.  Apply the CHG Soap to your body  ONLY FROM THE NECK DOWN.   Do not use on face/ open                           Wound or open sores. Avoid contact with eyes, ears mouth and genitals (private parts).                       Wash face,  Genitals (private parts) with your normal soap.             6.  Wash thoroughly, paying special attention to the area where your surgery  will be performed.  7.  Thoroughly rinse your body with warm water from the neck down.  8.  DO NOT shower/wash with your normal soap after using and rinsing off  the CHG Soap.                9.  Pat yourself dry with a clean towel.            10.  Wear clean pajamas.            11.  Place clean sheets on your bed the night of your first shower and do not  sleep with pets. Day of Surgery : Do not apply any lotions/deodorants the morning of  surgery.  Please wear clean clothes to the hospital/surgery center.  FAILURE TO FOLLOW THESE INSTRUCTIONS MAY RESULT IN THE CANCELLATION OF YOUR SURGERY PATIENT SIGNATURE_________________________________  NURSE SIGNATURE__________________________________  ________________________________________________________________________  Adam Phenix  An incentive spirometer is a tool that can help keep your lungs clear and active. This tool measures how well you are filling your lungs with each breath. Taking long deep breaths may help reverse or decrease the chance of developing breathing (pulmonary) problems (especially infection) following: A long period of time when you are unable to move or be active. BEFORE THE PROCEDURE  If the spirometer includes an indicator to show your best effort, your nurse or respiratory therapist will set it to a desired goal. If possible, sit up straight or lean slightly forward. Try not to slouch. Hold the incentive spirometer in an upright position. INSTRUCTIONS FOR USE  Sit on the edge of your bed if possible, or sit up as far as you can in bed or on a chair. Hold the incentive spirometer in an upright position. Breathe out normally. Place the mouthpiece in your mouth and seal your lips tightly around it. Breathe in slowly and as deeply as possible, raising the piston or the ball toward the top of the column. Hold your breath for 3-5 seconds or for as long as possible. Allow the piston or ball to fall to the bottom of the column. Remove the mouthpiece from your mouth and breathe out normally. Rest for a few seconds and repeat Steps 1 through 7 at least 10 times every 1-2 hours when you are awake. Take your time and take a few normal breaths between deep breaths. The spirometer may include an indicator to show your best effort. Use the indicator as a goal to work toward during each repetition. After each set of 10 deep breaths, practice coughing to be sure  your lungs are clear. If you have an incision (the cut made at the time of surgery), support your incision when coughing by placing a pillow or rolled up towels firmly against it. Once you are able to get out of bed, walk around indoors and cough well.  You may stop using the incentive spirometer when instructed by your caregiver.  RISKS AND COMPLICATIONS Take your time so you do not get dizzy or light-headed. If you are in pain, you may need to take or ask for pain medication before doing incentive spirometry. It is harder to take a deep breath if you are having pain. AFTER USE Rest and breathe slowly and easily. It can be helpful to keep track of a log of your progress. Your caregiver can provide you with a simple table to help with this. If you are using the spirometer at home, follow these instructions: [place] IF:  You are having difficultly using the spirometer. You have trouble using the spirometer as often as instructed. Your pain medication is not giving enough relief while using the spirometer. You develop fever of 100.5 F (38.1 C) or higher. SEEK IMMEDIATE MEDICAL CARE IF:  You cough up bloody sputum that had not been present before. You develop fever of 102 F (38.9 C) or greater. You develop worsening pain at or near the incision site. MAKE SURE YOU:  Understand these instructions. Will watch your condition. Will get help right away if you are not doing well or get worse. Document Released: 07/06/2006 Document Revised: 05/18/2011 Document Reviewed: 09/06/2006 [facility] Patient Information 2014 [place], [place].   ________________________________________________________________________

## 2022-05-22 ENCOUNTER — Encounter (HOSPITAL_COMMUNITY): Payer: Self-pay

## 2022-05-22 ENCOUNTER — Other Ambulatory Visit: Payer: Self-pay

## 2022-05-22 ENCOUNTER — Encounter (HOSPITAL_COMMUNITY)
Admission: RE | Admit: 2022-05-22 | Discharge: 2022-05-22 | Disposition: A | Discharge status: Home / Self Care | Payer: No Typology Code available for payment source | Source: Ambulatory Visit | Attending: Orthopedic Surgery | Admitting: Orthopedic Surgery

## 2022-05-22 VITALS — BP 137/75 | HR 63 | Temp 98.0°F | Ht 69.0 in | Wt 172.0 lb

## 2022-05-22 DIAGNOSIS — Z01812 Encounter for preprocedural laboratory examination: Secondary | ICD-10-CM | POA: Insufficient documentation

## 2022-05-22 DIAGNOSIS — Z01818 Encounter for other preprocedural examination: Secondary | ICD-10-CM

## 2022-05-22 LAB — CBC
HCT: 37.9 % (ref 36.0–46.0)
Hemoglobin: 12.4 g/dL (ref 12.0–15.0)
MCH: 31.6 pg (ref 26.0–34.0)
MCHC: 32.7 g/dL (ref 30.0–36.0)
MCV: 96.4 fL (ref 80.0–100.0)
Platelets: 226 10*3/uL (ref 150–400)
RBC: 3.93 MIL/uL (ref 3.87–5.11)
RDW: 12.5 % (ref 11.5–15.5)
WBC: 5.4 10*3/uL (ref 4.0–10.5)
nRBC: 0 % (ref 0.0–0.2)

## 2022-05-22 LAB — SURGICAL PCR SCREEN
MRSA, PCR: NEGATIVE
Staphylococcus aureus: NEGATIVE

## 2022-05-22 NOTE — Progress Notes (Addendum)
For Short Stay: [place] appointment date:  Bowel Prep reminder:   For Anesthesia: PCP - Dr. Tawnya Crook. : Clearance: 05/14/22: Chart Cardiologist -   Chest x-ray -  EKG -  Stress Test -  ECHO -  Cardiac Cath -  Pacemaker/ICD device last checked: Pacemaker orders received: Device Rep notified:  Spinal Cord Stimulator:  Sleep Study -  CPAP -   Fasting Blood Sugar -  Checks Blood Sugar _____ times a day Date and result of last Hgb A1c-  Last dose of GLP1 agonist-  GLP1 instructions:   Last dose of SGLT-2 inhibitors-  SGLT-2 instructions:   Blood Thinner Instructions: Aspirin Instructions: Last Dose:  Activity level: Can go up a flight of stairs and activities of daily living without stopping and without chest pain and/or shortness of breath   Able to exercise without chest pain and/or shortness of breath  Anesthesia review:   Patient denies shortness of breath, fever, cough and chest pain at PAT appointment   Patient verbalized understanding of instructions that were given to them at the PAT appointment. Patient was also instructed that they will need to review over the PAT instructions again at home before surgery.

## 2022-06-01 NOTE — H&P (Signed)
TOTAL KNEE ADMISSION H&P  Patient is being admitted for left total knee arthroplasty.  Subjective:  Chief Complaint:left knee pain.  HPI: Sabrina Harper [id number], 67 y.o. female, has a history of pain and functional disability in the left knee due to arthritis and has failed non-surgical conservative treatments for greater than 12 weeks to includeNSAID's and/or analgesics, corticosteriod injections, and activity modification.  Onset of symptoms was abrupt, starting 1 years ago with rapidlly worsening course since that time. The patient noted prior procedures on the knee to include  arthroscopy and menisectomy on the left knee(s).  Patient currently rates pain in the left knee(s) at 8 out of 10 with activity. Patient has worsening of pain with activity and weight bearing, pain that interferes with activities of daily living, and pain with passive range of motion.  Patient has evidence of  osteonecrosis  by imaging studies. here is no active infection.  Patient Active Problem List   Diagnosis Date Noted  . S/P arthroscopy of left knee 01/27/2022  . Hx of adenomatous colonic polyps 07/17/2020   Past Medical History:  Diagnosis Date  . Arthritis   . CIN II (cervical intraepithelial neoplasia II) 1994  . Family history of adverse reaction to anesthesia    Mom nausea  . GERD (gastroesophageal reflux disease)   . Hx of adenomatous colonic polyps 07/17/2020   2 diminutive adenomas  . Ringing of ears   . Skin cancer    Eyelid    Past Surgical History:  Procedure Laterality Date  . AUGMENTATION MAMMAPLASTY  2002   -saline  . CERVICAL BIOPSY  W/ LOOP ELECTRODE EXCISION  1994   CIN II  . CERVICAL CONE BIOPSY  1994  . CESAREAN SECTION     x2, 1982, 1988  . COLONOSCOPY  12/30/2006   [name]   . HYSTEROSCOPY  2000   resection of polyp  . KNEE ARTHROSCOPY Right 2006  . KNEE ARTHROSCOPY WITH MEDIAL MENISECTOMY Left 01/27/2022   Procedure: KNEE ARTHROSCOPY WITH MEDIAL MENISECTOMY, medial  condoplasty;  Surgeon: Paralee Cancel, MD;  Location: [facility];  Service: Orthopedics;  Laterality: Left;  . SKIN CANCER EXCISION     Eyelid  . TUBAL LIGATION      No current facility-administered medications for this encounter.   Current Outpatient Medications  Medication Sig Dispense Refill Last Dose  . Calcium Carb-Cholecalciferol (CALCIUM 500 + D3 PO) Take 2 each by mouth daily. Chewables     . EPINEPHrine 0.3 mg/0.3 mL IJ SOAJ injection Inject 0.3 mg into the muscle once as needed (anaphylaxis/allergic reaction). 1 each 1   . fluticasone (FLONASE) 50 MCG/ACT nasal spray Place 1 spray into both nostrils daily as needed for allergies.     Marland Kitchen ibuprofen (ADVIL) 200 MG tablet Take 200-600 mg by mouth every 6 (six) hours as needed for moderate pain.     . Multiple Vitamins-Minerals (IMMUNE SUPPORT VITAMIN C PO) Take 1 tablet by mouth daily.      Allergies  Allergen Reactions  . Other Anaphylaxis and Hives    Wheat straw  . Black Cohosh     Headaches   . Nitrofurantoin Hives and Other (See Comments)    Social History   Tobacco Use  . Smoking status: Never  . Smokeless tobacco: Never  Substance Use Topics  . Alcohol use: Yes    Comment: wine socially    Family History  Problem Relation Age of Onset  . Osteoporosis Mother   . Hypertension Mother   .  Kidney cancer Father        deceased  . Heart attack Brother 53  . Hypertension Brother   . Hypertension Brother   . Hypertension Maternal Grandmother   . Heart attack Maternal Grandfather   . Arthritis Paternal Grandmother   . Colon cancer Neg Hx   . Colon polyps Neg Hx   . Esophageal cancer Neg Hx   . Rectal cancer Neg Hx   . Stomach cancer Neg Hx      Review of Systems  Constitutional:  Negative for chills and fever.  Respiratory:  Negative for cough and shortness of breath.   Cardiovascular:  Negative for chest pain.  Gastrointestinal:  Negative for nausea and vomiting.  Musculoskeletal:  Positive for arthralgias.      Objective:  Physical Exam Well nourished and well developed. General: Alert and oriented x3, cooperative and pleasant, no acute distress. Head: normocephalic, atraumatic, neck supple. Eyes: EOMI.  Musculoskeletal: Left knee exam: Previously performed arthroscopic portal sites are well-healed without signs of infection No palpable effusion, warmth erythema Tenderness medially Slight flexion contracture related to pain with flexion over 110 degrees with tightness and pain  Calves soft and nontender. Motor function intact in LE. Strength 5/5 LE bilaterally. Neuro: Distal pulses 2+. Sensation to light touch intact in LE.  Vital signs in last 24 hours:    Labs:   Estimated body mass index is 25.4 kg/m as calculated from the following:   Height as of 05/22/22: 5\' 9"  (1.753 m).   Weight as of 05/22/22: 78 kg.   Imaging Review  Imaging: Today I reviewed her plain radiographs of her left knee following her arthroscopic surgery with progressive pain. These radiographs indicated concerns for a spontaneous osteonecrotic lesion of the knee or SONK. We also reviewed the MRI of her left knee that showed similar findings of this lesion with subchondral edematous changes involving her left medial femoral condyle.     Assessment/Plan:  Osteonecrosis, left knee   The patient history, physical examination, clinical judgment of the provider and imaging studies are consistent with Osteonecrosisof the left knee(s) and total knee arthroplasty is deemed medically necessary. The treatment options including medical management, injection therapy arthroscopy and arthroplasty were discussed at length. The risks and benefits of total knee arthroplasty were presented and reviewed. The risks due to aseptic loosening, infection, stiffness, patella tracking problems, thromboembolic complications and other imponderables were discussed. The patient acknowledged the explanation, agreed to proceed with the  plan and consent was signed. Patient is being admitted for inpatient treatment for surgery, pain control, PT, OT, prophylactic antibiotics, VTE prophylaxis, progressive ambulation and ADL's and discharge planning. The patient is planning to be discharged  home.  Therapy Plans: outpatient therapy at [facility] Disposition: Home with husband Planned DVT Prophylaxis: aspirin 81mg  BID DME needed: walker PCP: Dr. Cherlynn Kaiser, clearance received TXA: IV Allergies: macrobid - hives Anesthesia Concerns: none BMI: 25.9 Last HgbA1c: Not diabetic   Other: - oxycodone, robaxin, tylenol, celebrex - No hx of VTE or cancer - SDD  Costella Hatcher, PA-C Orthopedic Surgery [facility] [place] [phone]

## 2022-06-02 ENCOUNTER — Ambulatory Visit (HOSPITAL_COMMUNITY): Payer: No Typology Code available for payment source | Admitting: Certified Registered Nurse Anesthetist

## 2022-06-02 ENCOUNTER — Encounter (HOSPITAL_COMMUNITY): Payer: Self-pay | Admitting: Orthopedic Surgery

## 2022-06-02 ENCOUNTER — Encounter (HOSPITAL_COMMUNITY)
Admission: RE | Disposition: A | Discharge status: Home / Self Care | Payer: Self-pay | Source: Ambulatory Visit | Attending: Orthopedic Surgery

## 2022-06-02 ENCOUNTER — Ambulatory Visit (HOSPITAL_COMMUNITY)
Admission: RE | Admit: 2022-06-02 | Discharge: 2022-06-02 | Disposition: A | Discharge status: Home / Self Care | Payer: No Typology Code available for payment source | Source: Ambulatory Visit | Attending: Orthopedic Surgery | Admitting: Orthopedic Surgery

## 2022-06-02 ENCOUNTER — Other Ambulatory Visit: Payer: Self-pay

## 2022-06-02 DIAGNOSIS — M1712 Unilateral primary osteoarthritis, left knee: Secondary | ICD-10-CM

## 2022-06-02 DIAGNOSIS — M879 Osteonecrosis, unspecified: Secondary | ICD-10-CM | POA: Diagnosis not present

## 2022-06-02 DIAGNOSIS — Z8261 Family history of arthritis: Secondary | ICD-10-CM | POA: Insufficient documentation

## 2022-06-02 DIAGNOSIS — Z96652 Presence of left artificial knee joint: Secondary | ICD-10-CM

## 2022-06-02 HISTORY — PX: TOTAL KNEE ARTHROPLASTY: SHX125

## 2022-06-02 SURGERY — ARTHROPLASTY, KNEE, TOTAL
Anesthesia: Spinal | Site: Knee | Laterality: Left

## 2022-06-02 MED ORDER — LACTATED RINGERS IV BOLUS
500.0000 mL | Freq: Once | INTRAVENOUS | Status: AC
  Administered 2022-06-02: 500 mL via INTRAVENOUS

## 2022-06-02 MED ORDER — METHOCARBAMOL 500 MG PO TABS: 500.0000 mg | ORAL_TABLET | Freq: Four times a day (QID) | ORAL | Status: DC | PRN

## 2022-06-02 MED ORDER — DEXAMETHASONE SODIUM PHOSPHATE 10 MG/ML IJ SOLN
INTRAMUSCULAR | Status: AC
  Filled 2022-06-02: qty 1

## 2022-06-02 MED ORDER — DOCUSATE SODIUM 100 MG PO CAPS: 100.0000 mg | ORAL_CAPSULE | Freq: Two times a day (BID) | ORAL | Status: DC

## 2022-06-02 MED ORDER — 0.9 % SODIUM CHLORIDE (POUR BTL) OPTIME
TOPICAL | Status: DC | PRN
  Administered 2022-06-02: 1000 mL

## 2022-06-02 MED ORDER — BUPIVACAINE-EPINEPHRINE (PF) 0.25% -1:200000 IJ SOLN
INTRAMUSCULAR | Status: AC
  Filled 2022-06-02: qty 30

## 2022-06-02 MED ORDER — ONDANSETRON HCL 4 MG/2ML IJ SOLN: 4.0000 mg | Freq: Once | INTRAMUSCULAR | Status: DC | PRN

## 2022-06-02 MED ORDER — METOCLOPRAMIDE HCL 5 MG/ML IJ SOLN: 5.0000 mg | Freq: Three times a day (TID) | INTRAMUSCULAR | Status: DC | PRN

## 2022-06-02 MED ORDER — POLYETHYLENE GLYCOL 3350 17 G PO PACK: 17.0000 g | PACK | Freq: Two times a day (BID) | ORAL | Status: DC

## 2022-06-02 MED ORDER — PHENOL 1.4 % MT LIQD: 1.0000 | OROMUCOSAL | Status: DC | PRN

## 2022-06-02 MED ORDER — ORAL CARE MOUTH RINSE: 15.0000 mL | Freq: Once | OROMUCOSAL | Status: AC

## 2022-06-02 MED ORDER — CEFAZOLIN SODIUM-DEXTROSE 2-4 GM/100ML-% IV SOLN
INTRAVENOUS | Status: AC
  Administered 2022-06-02: 2 g via INTRAVENOUS
  Filled 2022-06-02: qty 100

## 2022-06-02 MED ORDER — KETOROLAC TROMETHAMINE 30 MG/ML IJ SOLN
INTRAMUSCULAR | Status: AC
  Filled 2022-06-02: qty 1

## 2022-06-02 MED ORDER — HYDROMORPHONE HCL 1 MG/ML IJ SOLN: 0.5000 mg | INTRAMUSCULAR | Status: DC | PRN

## 2022-06-02 MED ORDER — ACETAMINOPHEN 500 MG PO TABS: 1000.0000 mg | ORAL_TABLET | Freq: Four times a day (QID) | ORAL | Status: DC

## 2022-06-02 MED ORDER — OXYCODONE HCL 5 MG PO TABS
5.0000 mg | ORAL_TABLET | ORAL | Status: DC | PRN
  Administered 2022-06-02: 10 mg via ORAL

## 2022-06-02 MED ORDER — MENTHOL 3 MG MT LOZG: 1.0000 | LOZENGE | OROMUCOSAL | Status: DC | PRN

## 2022-06-02 MED ORDER — SODIUM CHLORIDE (PF) 0.9 % IJ SOLN
INTRAMUSCULAR | Status: AC
  Filled 2022-06-02: qty 30

## 2022-06-02 MED ORDER — PROPOFOL 500 MG/50ML IV EMUL
INTRAVENOUS | Status: DC | PRN
  Administered 2022-06-02: 100 ug/kg/min via INTRAVENOUS

## 2022-06-02 MED ORDER — ONDANSETRON HCL 4 MG PO TABS: 4.0000 mg | ORAL_TABLET | Freq: Four times a day (QID) | ORAL | Status: DC | PRN

## 2022-06-02 MED ORDER — METOCLOPRAMIDE HCL 5 MG PO TABS: 5.0000 mg | ORAL_TABLET | Freq: Three times a day (TID) | ORAL | Status: DC | PRN

## 2022-06-02 MED ORDER — DEXAMETHASONE SODIUM PHOSPHATE 10 MG/ML IJ SOLN: 10.0000 mg | Freq: Once | INTRAMUSCULAR | Status: DC

## 2022-06-02 MED ORDER — BUPIVACAINE-EPINEPHRINE (PF) 0.25% -1:200000 IJ SOLN
INTRAMUSCULAR | Status: DC | PRN
  Administered 2022-06-02: 30 mL

## 2022-06-02 MED ORDER — CEFAZOLIN SODIUM-DEXTROSE 2-4 GM/100ML-% IV SOLN: 2.0000 g | Freq: Four times a day (QID) | INTRAVENOUS | Status: DC

## 2022-06-02 MED ORDER — SODIUM CHLORIDE 0.9 % IR SOLN
Status: DC | PRN
  Administered 2022-06-02: 1000 mL

## 2022-06-02 MED ORDER — KETOROLAC TROMETHAMINE 30 MG/ML IJ SOLN
INTRAMUSCULAR | Status: DC | PRN
  Administered 2022-06-02: 30 mg via INTRAMUSCULAR

## 2022-06-02 MED ORDER — EPHEDRINE 5 MG/ML INJ
INTRAVENOUS | Status: AC
  Filled 2022-06-02: qty 5

## 2022-06-02 MED ORDER — ONDANSETRON HCL 4 MG/2ML IJ SOLN
INTRAMUSCULAR | Status: AC
  Filled 2022-06-02: qty 2

## 2022-06-02 MED ORDER — OXYCODONE HCL 5 MG/5ML PO SOLN: 5.0000 mg | Freq: Once | ORAL | Status: DC | PRN

## 2022-06-02 MED ORDER — TRANEXAMIC ACID-NACL 1000-0.7 MG/100ML-% IV SOLN
1000.0000 mg | INTRAVENOUS | Status: AC
  Administered 2022-06-02: 1000 mg via INTRAVENOUS
  Filled 2022-06-02: qty 100

## 2022-06-02 MED ORDER — DEXAMETHASONE SODIUM PHOSPHATE 10 MG/ML IJ SOLN
8.0000 mg | Freq: Once | INTRAMUSCULAR | Status: AC
  Administered 2022-06-02: 8 mg via INTRAVENOUS

## 2022-06-02 MED ORDER — OXYCODONE HCL 5 MG PO TABS: 10.0000 mg | ORAL_TABLET | ORAL | Status: DC | PRN

## 2022-06-02 MED ORDER — CHLORHEXIDINE GLUCONATE 0.12 % MT SOLN
15.0000 mL | Freq: Once | OROMUCOSAL | Status: AC
  Administered 2022-06-02: 15 mL via OROMUCOSAL

## 2022-06-02 MED ORDER — EPHEDRINE SULFATE-NACL 50-0.9 MG/10ML-% IV SOSY
PREFILLED_SYRINGE | INTRAVENOUS | Status: DC | PRN
  Administered 2022-06-02: 5 mg via INTRAVENOUS

## 2022-06-02 MED ORDER — CELECOXIB 200 MG PO CAPS: 200.0000 mg | ORAL_CAPSULE | Freq: Two times a day (BID) | ORAL | Status: DC

## 2022-06-02 MED ORDER — DIPHENHYDRAMINE HCL 12.5 MG/5ML PO ELIX: 12.5000 mg | ORAL_SOLUTION | ORAL | Status: DC | PRN

## 2022-06-02 MED ORDER — TRANEXAMIC ACID-NACL 1000-0.7 MG/100ML-% IV SOLN: 1000.0000 mg | Freq: Once | INTRAVENOUS | Status: AC

## 2022-06-02 MED ORDER — SODIUM CHLORIDE (PF) 0.9 % IJ SOLN
INTRAMUSCULAR | Status: DC | PRN
  Administered 2022-06-02: 30 mL

## 2022-06-02 MED ORDER — LACTATED RINGERS IV SOLN: INTRAVENOUS | Status: DC

## 2022-06-02 MED ORDER — METHOCARBAMOL 500 MG IVPB - SIMPLE MED: 500.0000 mg | Freq: Four times a day (QID) | INTRAVENOUS | Status: DC | PRN

## 2022-06-02 MED ORDER — LACTATED RINGERS IV BOLUS
250.0000 mL | Freq: Once | INTRAVENOUS | Status: AC
  Administered 2022-06-02: 250 mL via INTRAVENOUS

## 2022-06-02 MED ORDER — PROPOFOL 1000 MG/100ML IV EMUL
INTRAVENOUS | Status: AC
  Filled 2022-06-02: qty 100

## 2022-06-02 MED ORDER — HYDROMORPHONE HCL 1 MG/ML IJ SOLN: 0.2500 mg | INTRAMUSCULAR | Status: DC | PRN

## 2022-06-02 MED ORDER — FENTANYL CITRATE PF 50 MCG/ML IJ SOSY: 50.0000 ug | PREFILLED_SYRINGE | INTRAMUSCULAR | Status: DC

## 2022-06-02 MED ORDER — ONDANSETRON HCL 4 MG/2ML IJ SOLN: 4.0000 mg | Freq: Four times a day (QID) | INTRAMUSCULAR | Status: DC | PRN

## 2022-06-02 MED ORDER — FENTANYL CITRATE PF 50 MCG/ML IJ SOSY
PREFILLED_SYRINGE | INTRAMUSCULAR | Status: AC
  Administered 2022-06-02: 50 ug via INTRAVENOUS
  Filled 2022-06-02: qty 2

## 2022-06-02 MED ORDER — MIDAZOLAM HCL 2 MG/2ML IJ SOLN
INTRAMUSCULAR | Status: AC
  Administered 2022-06-02: 2 mg via INTRAVENOUS
  Filled 2022-06-02: qty 2

## 2022-06-02 MED ORDER — BUPIVACAINE IN DEXTROSE 0.75-8.25 % IT SOLN
INTRATHECAL | Status: DC | PRN
  Administered 2022-06-02: 1.8 mL via INTRATHECAL

## 2022-06-02 MED ORDER — TRANEXAMIC ACID-NACL 1000-0.7 MG/100ML-% IV SOLN
INTRAVENOUS | Status: AC
  Administered 2022-06-02: 1000 mg via INTRAVENOUS
  Filled 2022-06-02: qty 100

## 2022-06-02 MED ORDER — BISACODYL 10 MG RE SUPP: 10.0000 mg | Freq: Every day | RECTAL | Status: DC | PRN

## 2022-06-02 MED ORDER — PHENYLEPHRINE HCL-NACL 20-0.9 MG/250ML-% IV SOLN
INTRAVENOUS | Status: DC | PRN
  Administered 2022-06-02: 35 ug/min via INTRAVENOUS

## 2022-06-02 MED ORDER — MIDAZOLAM HCL 2 MG/2ML IJ SOLN: 1.0000 mg | INTRAMUSCULAR | Status: DC

## 2022-06-02 MED ORDER — OXYCODONE HCL 5 MG PO TABS: 5.0000 mg | ORAL_TABLET | Freq: Once | ORAL | Status: DC | PRN

## 2022-06-02 MED ORDER — OXYCODONE HCL 5 MG PO TABS
ORAL_TABLET | ORAL | Status: AC
  Filled 2022-06-02: qty 2

## 2022-06-02 MED ORDER — POVIDONE-IODINE 10 % EX SWAB
2.0000 | Freq: Once | CUTANEOUS | Status: AC
  Administered 2022-06-02: 2 via TOPICAL

## 2022-06-02 MED ORDER — PROPOFOL 10 MG/ML IV BOLUS
INTRAVENOUS | Status: DC | PRN
  Administered 2022-06-02: 40 mg via INTRAVENOUS

## 2022-06-02 MED ORDER — ROPIVACAINE HCL 5 MG/ML IJ SOLN
INTRAMUSCULAR | Status: DC | PRN
  Administered 2022-06-02: 30 mL via PERINEURAL

## 2022-06-02 MED ORDER — CEFAZOLIN SODIUM-DEXTROSE 2-4 GM/100ML-% IV SOLN
2.0000 g | INTRAVENOUS | Status: AC
  Administered 2022-06-02: 2 g via INTRAVENOUS
  Filled 2022-06-02: qty 100

## 2022-06-02 MED ORDER — ONDANSETRON HCL 4 MG/2ML IJ SOLN
INTRAMUSCULAR | Status: DC | PRN
  Administered 2022-06-02: 4 mg via INTRAVENOUS

## 2022-06-02 MED ORDER — ACETAMINOPHEN 325 MG PO TABS: 325.0000 mg | ORAL_TABLET | Freq: Four times a day (QID) | ORAL | Status: DC | PRN

## 2022-06-02 SURGICAL SUPPLY — 58 items
ADH SKN CLS APL DERMABOND .7 (GAUZE/BANDAGES/DRESSINGS) ×1
ATTUNE MED ANAT PAT 35 KNEE (Knees) IMPLANT
BAG COUNTER SPONGE SURGICOUNT (BAG) IMPLANT
BAG SPEC THK2 15X12 ZIP CLS (MISCELLANEOUS)
BAG SPNG CNTER NS LX DISP (BAG)
BAG ZIPLOCK 12X15 (MISCELLANEOUS) IMPLANT
BASEPLATE TIB CMT FB PCKT SZ5 (Knees) IMPLANT
BLADE SAW SGTL 11.0X1.19X90.0M (BLADE) IMPLANT
BLADE SAW SGTL 13.0X1.19X90.0M (BLADE) ×1 IMPLANT
BNDG CMPR 5X62 HK CLSR LF (GAUZE/BANDAGES/DRESSINGS) ×1
BNDG CMPR MED 10X6 ELC LF (GAUZE/BANDAGES/DRESSINGS) ×1
BNDG ELASTIC 6INX 5YD STR LF (GAUZE/BANDAGES/DRESSINGS) ×1 IMPLANT
BNDG ELASTIC 6X10 VLCR STRL LF (GAUZE/BANDAGES/DRESSINGS) IMPLANT
BOWL SMART MIX CTS (DISPOSABLE) ×1 IMPLANT
BSPLAT TIB 5 CMNT FXBRNG STRL (Knees) ×1 IMPLANT
CEMENT HV SMART SET (Cement) IMPLANT
COMP FEM CMT ATTUNE NRW 5 LT (Joint) ×1 IMPLANT
COMPONENT FEM CMT ATTN NRW 5LT (Joint) IMPLANT
COVER SURGICAL LIGHT HANDLE (MISCELLANEOUS) ×1 IMPLANT
CUFF TOURN SGL QUICK 34 (TOURNIQUET CUFF) ×1
CUFF TRNQT CYL 34X4.125X (TOURNIQUET CUFF) ×1 IMPLANT
DERMABOND ADVANCED .7 DNX12 (GAUZE/BANDAGES/DRESSINGS) ×1 IMPLANT
DRAPE U-SHAPE 47X51 STRL (DRAPES) ×1 IMPLANT
DRESSING AQUACEL AG SP 3.5X10 (GAUZE/BANDAGES/DRESSINGS) ×1 IMPLANT
DRSG AQUACEL AG ADV 3.5X10 (GAUZE/BANDAGES/DRESSINGS) IMPLANT
DRSG AQUACEL AG SP 3.5X10 (GAUZE/BANDAGES/DRESSINGS) ×1
DURAPREP 26ML APPLICATOR (WOUND CARE) ×2 IMPLANT
ELECT REM PT RETURN 15FT ADLT (MISCELLANEOUS) ×1 IMPLANT
GLOVE BIO SURGEON STRL SZ 6 (GLOVE) ×1 IMPLANT
GLOVE BIOGEL PI IND STRL 6.5 (GLOVE) ×1 IMPLANT
GLOVE BIOGEL PI IND STRL 7.5 (GLOVE) ×1 IMPLANT
GLOVE ORTHO TXT STRL SZ7.5 (GLOVE) ×2 IMPLANT
GOWN STRL REUS W/ TWL LRG LVL3 (GOWN DISPOSABLE) ×2 IMPLANT
GOWN STRL REUS W/TWL LRG LVL3 (GOWN DISPOSABLE) ×2
HANDPIECE INTERPULSE COAX TIP (DISPOSABLE) ×1
HOLDER FOLEY CATH W/STRAP (MISCELLANEOUS) IMPLANT
KIT TURNOVER KIT A (KITS) IMPLANT
LINER TIB FB ATTUNE 5X10 LT (Liner) IMPLANT
MANIFOLD NEPTUNE II (INSTRUMENTS) ×1 IMPLANT
NDL SAFETY ECLIP 18X1.5 (MISCELLANEOUS) IMPLANT
NS IRRIG 1000ML POUR BTL (IV SOLUTION) ×1 IMPLANT
PACK TOTAL KNEE CUSTOM (KITS) ×1 IMPLANT
PIN FIX SIGMA LCS THRD HI (PIN) IMPLANT
PROTECTOR NERVE ULNAR (MISCELLANEOUS) ×1 IMPLANT
SET HNDPC FAN SPRY TIP SCT (DISPOSABLE) ×1 IMPLANT
SET PAD KNEE POSITIONER (MISCELLANEOUS) ×1 IMPLANT
SPIKE FLUID TRANSFER (MISCELLANEOUS) ×2 IMPLANT
SUT MNCRL AB 4-0 PS2 18 (SUTURE) ×1 IMPLANT
SUT STRATAFIX PDS+ 0 24IN (SUTURE) ×1 IMPLANT
SUT VIC AB 1 CT1 36 (SUTURE) ×1 IMPLANT
SUT VIC AB 2-0 CT1 27 (SUTURE) ×2
SUT VIC AB 2-0 CT1 TAPERPNT 27 (SUTURE) ×2 IMPLANT
SYR 3ML LL SCALE MARK (SYRINGE) ×1 IMPLANT
TOWEL GREEN STERILE FF (TOWEL DISPOSABLE) ×1 IMPLANT
TRAY FOLEY MTR SLVR 16FR STAT (SET/KITS/TRAYS/PACK) ×1 IMPLANT
TUBE SUCTION HIGH CAP CLEAR NV (SUCTIONS) ×1 IMPLANT
WATER STERILE IRR 1000ML POUR (IV SOLUTION) ×2 IMPLANT
WRAP KNEE MAXI GEL POST OP (GAUZE/BANDAGES/DRESSINGS) ×1 IMPLANT

## 2022-06-02 NOTE — Anesthesia Preprocedure Evaluation (Addendum)
Anesthesia Evaluation  Patient identified by MRN, date of birth, ID band Patient awake    Reviewed: Allergy & Precautions, NPO status , Patient's Chart, lab work & pertinent test results  History of Anesthesia Complications (+) Family history of anesthesia reaction  Airway Mallampati: II  TM Distance: >3 FB Neck ROM: Full    Dental no notable dental hx. (+) Teeth Intact, Caps, Dental Advisory Given   Pulmonary    Pulmonary exam normal breath sounds clear to auscultation       Cardiovascular negative cardio ROS Normal cardiovascular exam Rhythm:Regular Rate:Normal     Neuro/Psych    GI/Hepatic Neg liver ROS,GERD  ,,  Endo/Other  negative endocrine ROS    Renal/GU negative Renal ROS     Musculoskeletal  (+) Arthritis , Osteoarthritis,  OA left knee AVN left knee   Abdominal   Peds  Hematology negative hematology ROS (+)   Anesthesia Other Findings   Reproductive/Obstetrics                              Anesthesia Physical Anesthesia Plan  ASA: 2  Anesthesia Plan: Spinal   Post-op Pain Management: Minimal or no pain anticipated, Regional block* and Precedex   Induction: Intravenous  PONV Risk Score and Plan: Propofol infusion and Treatment may vary due to age or medical condition  Airway Management Planned: Natural Airway and Simple Face Mask  Additional Equipment: None  Intra-op Plan:   Post-operative Plan:   Informed Consent: I have reviewed the patients History and Physical, chart, labs and discussed the procedure including the risks, benefits and alternatives for the proposed anesthesia with the patient or authorized representative who has indicated his/her understanding and acceptance.     Dental advisory given  Plan Discussed with: CRNA and Anesthesiologist  Anesthesia Plan Comments:          Anesthesia Quick Evaluation

## 2022-06-02 NOTE — Op Note (Signed)
NAME:  Sabrina Harper                      MEDICAL RECORD NO.:  [id number]                             FACILITY:  [facility]      PHYSICIAN:  Pietro Cassis. Alvan Dame, M.D.  DATE OF BIRTH:  [birth year]      DATE OF PROCEDURE:  06/02/2022                                     OPERATIVE REPORT         PREOPERATIVE DIAGNOSIS:  Left knee osteoarthritis.      POSTOPERATIVE DIAGNOSIS:  Left knee osteoarthritis.      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  Significant osteonecrosis involving her medial femoral condyle was found.     PROCEDURE:  Left total knee replacement.      COMPONENTS USED:  DePuy Attune rotating platform posterior stabilized knee   system, a size 5N femur, 5 tibia, size 10 mm FB CR MS AOX insert, and 35 anatomic patellar   button.      SURGEON:  Pietro Cassis. Alvan Dame, M.D.      ASSISTANT:  Costella Hatcher, PA-C.      ANESTHESIA:  Regional and Spinal.      SPECIMENS:  None.      COMPLICATION:  None.      DRAINS:  None.  EBL: <100 cc      TOURNIQUET TIME:  29 min at 225 mmHg     The patient was stable to the recovery room.      INDICATION FOR PROCEDURE:  Sabrina Harper is a 67 y.o. female patient of   mine.  The patient had been seen, evaluated, and treated for months conservatively in the   office with medication, activity modification, and injections.  The patient had   radiographic changes of bone-on-bone arthritis with endplate sclerosis and osteophytes noted.  Based on the radiographic changes and failed conservative measures, the patient   decided to proceed with definitive treatment, total knee replacement.  Risks of infection, DVT, component failure, need for revision surgery, neurovascular injury were reviewed in the office setting.  The postop course was reviewed stressing the efforts to maximize post-operative satisfaction  and function.  Consent was obtained for benefit of pain   relief.      PROCEDURE IN DETAIL:  The patient was brought to the operative theater.   Once adequate anesthesia, preoperative antibiotics, 2 gm of Ancef,1 gm of Tranexamic Acid, and 10 mg of Decadron administered, the patient was positioned supine with a left thigh tourniquet placed.  The  left lower extremity was prepped and draped in sterile fashion.  A time-   out was performed identifying the patient, planned procedure, and the appropriate extremity.      The left lower extremity was placed in the [facility] leg holder.  The leg was   exsanguinated, tourniquet elevated to 225 mmHg.  A midline incision was   made followed by median parapatellar arthrotomy.  Following initial   exposure, attention was first directed to the patella.  Precut   measurement was noted to be 24  mm.  I resected down to 13-14 mm and used a   35 anatomic patellar button to restore patellar height as well as cover the cut surface.      The lug holes were drilled and a metal shim was placed to protect the   patella from retractors and saw blade during the procedure.      At this point, attention was now directed to the femur.  The femoral   canal was opened with a drill, irrigated to try to prevent fat emboli.  An   intramedullary rod was passed at 3 degrees valgus, 9 mm of bone was   resected off the distal femur.  Following this resection, the tibia was   subluxated anteriorly.  Using the extramedullary guide, 2 mm of bone was resected off   the proximal media tibia.  We confirmed the gap would be   stable medially and laterally with a size 5 spacer block as well as confirmed that the tibial cut was perpendicular in the coronal plane, checking with an alignment rod.      Once this was done, I sized the femur to be a size 5 in the anterior-   posterior dimension, chose a narrow component based on medial and   lateral dimension.  The size 5 rotation block was  then pinned in   position anterior referenced using the C-clamp to set rotation.  The   anterior, posterior, and  chamfer cuts were made without difficulty nor   notching making certain that I was along the anterior cortex to help   with flexion gap stability.      The final femoral shim cut was made off the lateral aspect of distal femur.      At this point, the tibia was sized to be a size 5.  The size 5 tray was   then pinned in position after setting rotation by floating a trial tray on reduction then drilled, and keel punched.  Trial reduction was now carried with a 5 femur,  5 tibia, a size 10 mm CR MS insert, and the 35 anatomic patella botton.  The knee was brought to full extension with good flexion stability with the patella   tracking through the trochlea without application of pressure.  Given   all these findings the trial components removed.  Final components were   opened and cement was mixed.  The knee was irrigated with normal saline solution and pulse lavage.  The synovial lining was   then injected with 30 cc of 0.25% Marcaine with epinephrine, 1 cc of Toradol and 30 cc of NS for a total of 61 cc.     Final implants were then cemented onto cleaned and dried cut surfaces of bone with the knee brought to extension with a size 10 mm CR MS trial insert.      Once the cement had fully cured, excess cement was removed   throughout the knee.  I confirmed that I was satisfied with the range of   motion and stability, and the final size 10 mm FB CR MS AOX insert was chosen.  It was   placed into the knee.      The tourniquet had been let down at 29 minutes.  No significant   hemostasis was required.  The extensor mechanism was then reapproximated using #1 Vicryl and #1 Stratafix sutures with the knee   in flexion.  The   remaining wound was closed with 2-0 Vicryl  and running 4-0 Monocryl.   The knee was cleaned, dried, dressed sterilely using Dermabond and   Aquacel dressing.   The patient was then   brought to recovery room in stable condition, tolerating the procedure   well.   Please note that Physician Assistant, Costella Hatcher, PA-C was present for the entirety of the case, and was utilized for pre-operative positioning, peri-operative retractor management, general facilitation of the procedure and for primary wound closure at the end of the case.              Pietro Cassis Alvan Dame, M.D.    06/02/2022 8:44 AM

## 2022-06-02 NOTE — Anesthesia Procedure Notes (Signed)
Anesthesia Regional Block: Adductor canal block   Pre-Anesthetic Checklist: , timeout performed,  Correct Patient, Correct Site, Correct Laterality,  Correct Procedure, Correct Position, site marked,  Risks and benefits discussed,  Surgical consent,  Pre-op evaluation,  At surgeon's request and post-op pain management  Laterality: Left  Prep: chloraprep       Needles:  Injection technique: Single-shot  Needle Type: Echogenic Stimulator Needle     Needle Length: 10cm  Needle Gauge: 21   Needle insertion depth: 7 cm   Additional Needles:   Procedures:,,,, ultrasound used (permanent image in chart),,    Narrative:  Start time: 06/02/2022 9:37 AM End time: 06/02/2022 9:42 AM Injection made incrementally with aspirations every 5 mL.  Performed by: Personally  Anesthesiologist: Josephine Igo, MD  Additional Notes: Timeout performed. Patient sedated. Relevant anatomy ID'd using [place]. Incremental 2-66ml injection of LA with frequent aspiration. Patient tolerated procedure well.

## 2022-06-02 NOTE — Anesthesia Procedure Notes (Signed)
Spinal  Patient location during procedure: OR End time: 06/02/2022 9:56 AM Reason for block: surgical anesthesia Staffing Performed: resident/CRNA  Anesthesiologist: Josephine Igo, MD Resident/CRNA: Maxwell Caul, CRNA Performed by: Maxwell Caul, CRNA Authorized by: Josephine Igo, MD   Preanesthetic Checklist Completed: patient identified, IV checked, site marked, risks and benefits discussed, surgical consent, monitors and equipment checked, pre-op evaluation and timeout performed Spinal Block Patient position: sitting Prep: DuraPrep Patient monitoring: heart rate, cardiac monitor, continuous pulse ox and blood pressure Approach: midline Location: L3-4 Injection technique: single-shot Needle Needle type: Pencan  Needle gauge: 24 G Needle length: 10 cm Assessment Sensory level: T4 Events: CSF return Additional Notes IV functioning, monitors applied to pt. Expiration date of kit checked and confirmed to be in date. Sterile prep and drape, hand hygiene and sterile gloves used. Pt was positioned and spine was prepped in sterile fashion. Skin was anesthetized with lidocaine. Free flow of clear CSF obtained prior to injecting local anesthetic into CSF x 1 attempt. Spinal needle aspirated freely following injection. Needle was carefully withdrawn, and pt tolerated procedure well. Loss of motor and sensory on exam post injection. Dr Royce Macadamia at bedside during entire placement.

## 2022-06-02 NOTE — Discharge Instructions (Addendum)

## 2022-06-02 NOTE — Interval H&P Note (Signed)
History and Physical Interval Note:  06/02/2022 8:37 AM  Sabrina Harper  has presented today for surgery, with the diagnosis of Left knee osteoarthritis, avascular necrosis left medial femoral condyle.  The various methods of treatment have been discussed with the patient and family. After consideration of risks, benefits and other options for treatment, the patient has consented to  Procedure(s): TOTAL KNEE ARTHROPLASTY (Left) as a surgical intervention.  The patient's history has been reviewed, patient examined, no change in status, stable for surgery.  I have reviewed the patient's chart and labs.  Questions were answered to the patient's satisfaction.     [name]

## 2022-06-02 NOTE — Transfer of Care (Signed)
Immediate Anesthesia Transfer of Care Note  Patient: Sabrina Harper [facility]  Procedure(s) Performed: TOTAL KNEE ARTHROPLASTY (Left: Knee)  Patient Location: PACU  Anesthesia Type:MAC and Spinal  Level of Consciousness: awake, alert , and oriented  Airway & Oxygen Therapy: Patient Spontanous Breathing and Patient connected to face mask oxygen  Post-op Assessment: Report given to RN and Post -op Vital signs reviewed and stable  Post vital signs: Reviewed and stable  Last Vitals:  Vitals Value Taken Time  BP 112/67 06/02/22 1130  Temp    Pulse 104 06/02/22 1131  Resp 17 06/02/22 1131  SpO2 97 % 06/02/22 1131  Vitals shown include unvalidated device data.  Last Pain:  Vitals:   06/02/22 0946  TempSrc:   PainSc: 0-No pain         Complications: No notable events documented.

## 2022-06-02 NOTE — Anesthesia Postprocedure Evaluation (Signed)
Anesthesia Post Note  Patient: Sabrina Harper [facility]  Procedure(s) Performed: TOTAL KNEE ARTHROPLASTY (Left: Knee)     Patient location during evaluation: PACU Anesthesia Type: Spinal Level of consciousness: oriented and awake and alert Pain management: pain level controlled Vital Signs Assessment: post-procedure vital signs reviewed and stable Respiratory status: spontaneous breathing, respiratory function stable and nonlabored ventilation Cardiovascular status: blood pressure returned to baseline and stable Postop Assessment: no headache, no backache, no apparent nausea or vomiting, patient able to bend at knees and spinal receding Anesthetic complications: no   No notable events documented.  Last Vitals:  Vitals:   06/02/22 1230 06/02/22 1245  BP: 120/71 102/73  Pulse: 77 79  Resp: 12   Temp:    SpO2: 100% 99%    Last Pain:  Vitals:   06/02/22 1245  TempSrc:   PainSc: 0-No pain                 Vondell Babers A.

## 2022-06-02 NOTE — Anesthesia Procedure Notes (Signed)
Procedure Name: MAC Date/Time: 06/02/2022 9:49 AM  Performed by: Maxwell Caul, CRNAPre-anesthesia Checklist: Patient identified, Emergency Drugs available, Suction available and Patient being monitored Oxygen Delivery Method: Simple face mask

## 2022-06-02 NOTE — Evaluation (Signed)
Physical Therapy Evaluation Patient Details Name: Sabrina Harper MRN: [id number] DOB: 06-27-1955 Today's Date: 06/02/2022  History of Present Illness  Pt is a 67yo female presenting s/p L TKA on 06/02/2022 due to failure of conservative measures including L knee arthroscopy with medial menisectomy on 01/27/22. Pt PMH includes but is not limited to: cervical intraepithelial neoplasia of CN2, GERD, and R knee arthroscopy.  Clinical Impression    Sabrina Harper is a 67 y.o. female POD 0 s/p L TKA. Patient reports IND with mobility at baseline. Patient is now limited by functional impairments (see PT problem list below) and requires min guard for transfers and gait with RW. Patient was able to ambulate 45 and 40 feet with RW and min guard and cues for safe walker management. Patient educated on safe sequencing for stair mobility, car transfers, pain management with husband reporting having ice man machine and importance of use of RW vs rollator for improved safety and stability pt and husband verbalized understanding safe guarding position for people assisting with mobility. Patient instructed in exercises to facilitate ROM and circulation reviewed and HO provided  Patient will benefit from continued skilled PT interventions to address impairments and progress towards PLOF. Patient has met mobility goals at adequate level for discharge home with family support and pt stating OPPT; will continue to follow if pt continues acute stay to progress towards Mod I goals.      Recommendations for follow up therapy are one component of a multi-disciplinary discharge planning process, led by the attending physician.  Recommendations may be updated based on patient status, additional functional criteria and insurance authorization.  Follow Up Recommendations       Assistance Recommended at Discharge Intermittent Supervision/Assistance  Patient can return home with the following  A little help with walking  and/or transfers;A little help with bathing/dressing/bathroom;Assistance with cooking/housework;Assist for transportation;Help with stairs or ramp for entrance    Equipment Recommendations Rolling walker (2 wheels) (provided and adjusted at eval)  Recommendations for Other Services       Functional Status Assessment Patient has had a recent decline in their functional status and demonstrates the ability to make significant improvements in function in a reasonable and predictable amount of time.     Precautions / Restrictions Precautions Precautions: Knee;Fall Restrictions Weight Bearing Restrictions: No      Mobility  Bed Mobility Overal bed mobility: Needs Assistance Bed Mobility: Supine to Sit     Supine to sit: Supervision     General bed mobility comments: min cues for technique    Transfers Overall transfer level: Needs assistance   Transfers: Sit to/from Stand Sit to Stand: Min guard           General transfer comment: cues for UE, AD and LLE placement with bed, recliner and commode transfer    Ambulation/Gait Ambulation/Gait assistance: Min guard Gait Distance (Feet): 45 Feet Assistive device: Rolling walker (2 wheels) Gait Pattern/deviations: Step-through pattern Gait velocity: decreased     General Gait Details: minimal WB though B UE, step through pattern and LEs clearing and passing in stance phase  Stairs Stairs: Yes Stairs assistance: Min guard Stair Management: Two rails Number of Stairs: 2 General stair comments: cues for safety and sequencing with technique (education provided on proper technique with use of RW on deeper steps to enter home)  Wheelchair Mobility    Modified Rankin (Stroke Patients Only)       Balance Overall balance assessment: Needs assistance Sitting-balance support: Feet supported  Sitting balance-Leahy Scale: Fair     Standing balance support: No upper extremity supported (static standing, B UE support for  gait and dynamic balance) Standing balance-Leahy Scale: Fair                               Pertinent Vitals/Pain Pain Assessment Pain Assessment: 0-10 Pain Score: 4  Pain Location: L knee Pain Descriptors / Indicators: Aching, Constant, Discomfort, Operative site guarding Pain Intervention(s): Limited activity within patient's tolerance, Monitored during session, Ice applied    Home Living Family/patient expects to be discharged to:: Private residence Living Arrangements: Spouse/significant other Available Help at Discharge: Family Type of Home: House Home Access: Stairs to enter Entrance Stairs-Rails: None Entrance Stairs-Number of Steps: 3   Home Layout: Multi-level;Able to live on main level with bedroom/bathroom Home Equipment: Rollator (4 wheels);Cane - single point;Crutches;Wheelchair - power      Prior Function Prior Level of Function : Independent/Modified Independent             Mobility Comments: IND with all ADls and self care tasks, IADLs and driving intermittent use of SPC for the past few weeks       Hand Dominance        Extremity/Trunk Assessment        Lower Extremity Assessment Lower Extremity Assessment: LLE deficits/detail LLE Deficits / Details: ankle DF/PF 5/5, SLR < 10 degree lag LLE Sensation: WNL (once in standing pt reports some numbness on plantar surface of foot)       Communication   Communication: No difficulties  Cognition Arousal/Alertness: Awake/alert Behavior During Therapy: WFL for tasks assessed/performed Overall Cognitive Status: Within Functional Limits for tasks assessed                                          General Comments      Exercises Total Joint Exercises Ankle Circles/Pumps: AROM, Both, 20 reps Quad Sets: AROM, Left, 5 reps Heel Slides: AROM, Left, 5 reps Hip ABduction/ADduction: AROM, Left, 5 reps, Seated Straight Leg Raises: AROM, Left, 5 reps Long Arc Quad: AROM,  Left, 5 reps, Seated   Assessment/Plan    PT Assessment Patient needs continued PT services  PT Problem List Decreased strength;Decreased range of motion;Decreased activity tolerance;Decreased balance;Decreased mobility;Decreased coordination;Decreased knowledge of use of DME;Pain       PT Treatment Interventions DME instruction;Gait training;Stair training;Functional mobility training;Therapeutic activities;Therapeutic exercise;Balance training;Neuromuscular re-education;Patient/family education;Modalities    PT Goals (Current goals can be found in the Care Plan section)  Acute Rehab PT Goals Patient Stated Goal: yarwork and prepare for the next TKA, pickleball PT Goal Formulation: With patient Time For Goal Achievement: 06/16/22 Potential to Achieve Goals: Good    Frequency       Co-evaluation               AM-PAC PT "6 Clicks" Mobility  Outcome Measure Help needed turning from your back to your side while in a flat bed without using bedrails?: None Help needed moving from lying on your back to sitting on the side of a flat bed without using bedrails?: A Little Help needed moving to and from a bed to a chair (including a wheelchair)?: A Little Help needed standing up from a chair using your arms (e.g., wheelchair or bedside chair)?: A Little Help needed to walk in hospital  room?: A Little Help needed climbing 3-5 steps with a railing? : A Little 6 Click Score: 19    End of Session Equipment Utilized During Treatment: Gait belt Activity Tolerance: Patient tolerated treatment well Patient left: in chair;with chair alarm set;with nursing/sitter in room Nurse Communication: Mobility status;Other (comment) (pt prgression toward d/c) PT Visit Diagnosis: Unsteadiness on feet (R26.81);Other abnormalities of gait and mobility (R26.89);Muscle weakness (generalized) (M62.81);Pain Pain - Right/Left: Left Pain - part of body: Leg;Knee    Time: [id number] PT Time Calculation  (min) (ACUTE ONLY): 42 min   Charges:   PT Evaluation $PT Eval Low Complexity: 1 Low PT Treatments $Gait Training: 8-22 mins $Therapeutic Exercise: 8-22 mins       Baird Lyons, PT  Adair Patter 06/02/2022, 2:41 PM

## 2022-06-03 ENCOUNTER — Encounter (HOSPITAL_COMMUNITY): Payer: Self-pay | Admitting: Orthopedic Surgery

## 2022-06-05 DIAGNOSIS — M25662 Stiffness of left knee, not elsewhere classified: Secondary | ICD-10-CM | POA: Diagnosis not present

## 2022-06-05 DIAGNOSIS — M25562 Pain in left knee: Secondary | ICD-10-CM | POA: Diagnosis not present

## 2022-06-10 DIAGNOSIS — M25662 Stiffness of left knee, not elsewhere classified: Secondary | ICD-10-CM | POA: Diagnosis not present

## 2022-06-10 DIAGNOSIS — M25562 Pain in left knee: Secondary | ICD-10-CM | POA: Diagnosis not present

## 2022-06-12 DIAGNOSIS — M25662 Stiffness of left knee, not elsewhere classified: Secondary | ICD-10-CM | POA: Diagnosis not present

## 2022-06-12 DIAGNOSIS — M25562 Pain in left knee: Secondary | ICD-10-CM | POA: Diagnosis not present

## 2022-06-17 DIAGNOSIS — M25562 Pain in left knee: Secondary | ICD-10-CM | POA: Diagnosis not present

## 2022-06-19 DIAGNOSIS — M25662 Stiffness of left knee, not elsewhere classified: Secondary | ICD-10-CM | POA: Diagnosis not present

## 2022-06-19 DIAGNOSIS — M25562 Pain in left knee: Secondary | ICD-10-CM | POA: Diagnosis not present

## 2022-06-22 DIAGNOSIS — H524 Presbyopia: Secondary | ICD-10-CM | POA: Diagnosis not present

## 2022-06-23 DIAGNOSIS — H524 Presbyopia: Secondary | ICD-10-CM | POA: Diagnosis not present

## 2022-06-23 DIAGNOSIS — H52223 Regular astigmatism, bilateral: Secondary | ICD-10-CM | POA: Diagnosis not present

## 2022-06-24 DIAGNOSIS — M25562 Pain in left knee: Secondary | ICD-10-CM | POA: Diagnosis not present

## 2022-06-24 DIAGNOSIS — M25662 Stiffness of left knee, not elsewhere classified: Secondary | ICD-10-CM | POA: Diagnosis not present

## 2022-06-26 DIAGNOSIS — M25562 Pain in left knee: Secondary | ICD-10-CM | POA: Diagnosis not present

## 2022-06-26 DIAGNOSIS — M25662 Stiffness of left knee, not elsewhere classified: Secondary | ICD-10-CM | POA: Diagnosis not present

## 2022-06-26 DIAGNOSIS — Z1231 Encounter for screening mammogram for malignant neoplasm of breast: Secondary | ICD-10-CM | POA: Diagnosis not present

## 2022-06-26 LAB — HM MAMMOGRAPHY

## 2022-07-01 ENCOUNTER — Encounter: Payer: Self-pay | Admitting: Family Medicine

## 2022-07-01 ENCOUNTER — Ambulatory Visit (INDEPENDENT_AMBULATORY_CARE_PROVIDER_SITE_OTHER): Payer: No Typology Code available for payment source | Admitting: Family Medicine

## 2022-07-01 VITALS — BP 130/70 | HR 80 | Temp 98.6°F | Resp 16 | Ht 69.0 in | Wt 174.0 lb

## 2022-07-01 DIAGNOSIS — R110 Nausea: Secondary | ICD-10-CM

## 2022-07-01 DIAGNOSIS — R531 Weakness: Secondary | ICD-10-CM

## 2022-07-01 NOTE — Patient Instructions (Signed)
Take Nexium twice daily.  Small amounts food frequently.   Worse.  Zofran if needed.

## 2022-07-01 NOTE — Progress Notes (Signed)
Subjective:     Patient ID: Sabrina Harper, female    DOB: 1956-01-01, 67 y.o.   MRN: [id number]  Chief Complaint  Patient presents with   Nausea    Nausea after eating that started 3 or 4 days ago, feeling weak Knee replacement 4 weeks ago    HPI Past several days, nausea after eating. And some otherwise.  Belching a lot.  Started taking Nexium after surgery.  A lot of reflux-helped after surgery.  Still taking Nexium. Feels tight upper abdomen.  No dysphagia.  No dark stools.  Regular bowel movement.  No pain medications  since surgery.  Taking Celebrex and ASA.  Stopped few days ago.  Has been taking tramadol at nightly. Feeling weak Left total knee arthroplasty / replacement 06/02/22.  Nausea started Sunday, stopped medications Monday.   No fevers/chills.  No v/D   Health Maintenance Due  Topic Date Due   Medicare Annual Wellness (AWV)  Never done    Past Medical History:  Diagnosis Date   Arthritis    CIN II (cervical intraepithelial neoplasia II) 1994   Family history of adverse reaction to anesthesia    Mom nausea   GERD (gastroesophageal reflux disease)    Hx of adenomatous colonic polyps 07/17/2020   2 diminutive adenomas   Ringing of ears    Skin cancer    Eyelid    Past Surgical History:  Procedure Laterality Date   AUGMENTATION MAMMAPLASTY  2002   -saline   CERVICAL BIOPSY  W/ LOOP ELECTRODE EXCISION  1994   CIN II   CERVICAL CONE BIOPSY  1994   CESAREAN SECTION     x2, 1982, 1988   COLONOSCOPY  12/30/2006   [facility]    HYSTEROSCOPY  2000   resection of polyp   KNEE ARTHROSCOPY Right 2006   KNEE ARTHROSCOPY WITH MEDIAL MENISECTOMY Left 01/27/2022   Procedure: KNEE ARTHROSCOPY WITH MEDIAL MENISECTOMY, medial condoplasty;  Surgeon: Durene Romans, MD;  Location: [facility];  Service: Orthopedics;  Laterality: Left;   SKIN CANCER EXCISION     Eyelid   TOTAL KNEE ARTHROPLASTY Left 06/02/2022   Procedure: TOTAL KNEE ARTHROPLASTY;  Surgeon: Durene Romans, MD;   Location: [facility];  Service: Orthopedics;  Laterality: Left;   TUBAL LIGATION       Current Outpatient Medications:    fluticasone (FLONASE) 50 MCG/ACT nasal spray, Place 1 spray into both nostrils daily as needed for allergies., Disp: , Rfl:    ondansetron (ZOFRAN-ODT) 4 MG disintegrating tablet, TAKE 1 TABLET BY MOUTH EVERY 6 HOURS AS NEEDED FOR NAUSEA AND VOMITING, Disp: , Rfl:    traMADol (ULTRAM) 50 MG tablet, Take 50 mg by mouth every 6 (six) hours as needed., Disp: , Rfl:    Calcium Carb-Cholecalciferol (CALCIUM 500 + D3 PO), Take 2 each by mouth daily. Chewables (Patient not taking: Reported on 07/01/2022), Disp: , Rfl:    cetirizine (ZYRTEC) 5 MG tablet, Take 5 mg by mouth daily. (Patient not taking: Reported on 07/01/2022), Disp: , Rfl:    EPINEPHrine 0.3 mg/0.3 mL IJ SOAJ injection, Inject 0.3 mg into the muscle once as needed (anaphylaxis/allergic reaction). (Patient not taking: Reported on 07/01/2022), Disp: 1 each, Rfl: 1   Multiple Vitamins-Minerals (IMMUNE SUPPORT VITAMIN C PO), Take 1 tablet by mouth daily. (Patient not taking: Reported on 07/01/2022), Disp: , Rfl:   Allergies  Allergen Reactions   Other Anaphylaxis and Hives    Wheat straw   Black Cohosh  Headaches    Nitrofurantoin Hives and Other (See Comments)   ROS neg/noncontributory except as noted HPI/below      Objective:     BP 130/70   Pulse 80   Temp 98.6 F (37 C) (Temporal)   Resp 16   Ht  (1.753 m)   Wt 174 lb (78.9 kg)   LMP 03/09/2004   SpO2 99%   BMI 25.70 kg/m  Wt Readings from Last 3 Encounters:  07/01/22 174 lb (78.9 kg)  06/02/22 171 lb 15.3 oz (78 kg)  05/22/22 172 lb (78 kg)    Physical Exam   Gen: [facility] NAD.  Possibly a little pale HEENT: NCAT, conjunctiva not injected, sclera nonicteric NECK:  supple, no thyromegaly, no nodes, no carotid bruits CARDIAC: RRR, S1S2+, no murmur. DP 2+B LUNGS: CTAB. No wheezes ABDOMEN:  BS+, soft, NTND, No HSM, no masses EXT:  no  edema MSK: no gross abnormalities.  NEURO: A&O x3.  CN II-XII intact.  PSYCH: normal mood. Good eye contact     Assessment & Plan:  Nausea -     CBC with Differential/Platelet -     Comprehensive metabolic panel  Weakness -     CBC with Differential/Platelet -     Comprehensive metabolic panel  Nausea/weakness-? Withdrawal medications, ?GERD,?anemia, viral, other.   Nexium twice daily, check CMP/CBC.  Zofran if needed.  Small amounts food frequently.  Worse, new symptoms, let us know.  Has annual next week(s).   Angelena Sole, MD

## 2022-07-02 LAB — CBC WITH DIFFERENTIAL/PLATELET
Basophils Absolute: 0 10*3/uL (ref 0.0–0.1)
Basophils Relative: 0.6 % (ref 0.0–3.0)
Eosinophils Absolute: 0.2 10*3/uL (ref 0.0–0.7)
Eosinophils Relative: 3.4 % (ref 0.0–5.0)
HCT: 32.2 % — ABNORMAL LOW (ref 36.0–46.0)
Hemoglobin: 10.9 g/dL — ABNORMAL LOW (ref 12.0–15.0)
Lymphocytes Relative: 27.3 % (ref 12.0–46.0)
Lymphs Abs: 1.7 10*3/uL (ref 0.7–4.0)
MCHC: 33.8 g/dL (ref 30.0–36.0)
MCV: 95.2 fl (ref 78.0–100.0)
Monocytes Absolute: 0.4 10*3/uL (ref 0.1–1.0)
Monocytes Relative: 6.3 % (ref 3.0–12.0)
Neutro Abs: 3.9 10*3/uL (ref 1.4–7.7)
Neutrophils Relative %: 62.4 % (ref 43.0–77.0)
Platelets: 269 10*3/uL (ref 150.0–400.0)
RBC: 3.38 Mil/uL — ABNORMAL LOW (ref 3.87–5.11)
RDW: 13.6 % (ref 11.5–15.5)
WBC: 6.2 10*3/uL (ref 4.0–10.5)

## 2022-07-02 LAB — COMPREHENSIVE METABOLIC PANEL
ALT: 27 U/L (ref 0–35)
AST: 15 U/L (ref 0–37)
Albumin: 4 g/dL (ref 3.5–5.2)
Alkaline Phosphatase: 104 U/L (ref 39–117)
BUN: 15 mg/dL (ref 6–23)
CO2: 28 mEq/L (ref 19–32)
Calcium: 9.3 mg/dL (ref 8.4–10.5)
Chloride: 105 mEq/L (ref 96–112)
Creatinine, Ser: 0.65 mg/dL (ref 0.40–1.20)
GFR: 92 mL/min (ref >60.00)
Glucose, Bld: 100 mg/dL — ABNORMAL HIGH (ref 70–99)
Potassium: 4.3 mEq/L (ref 3.5–5.1)
Sodium: 140 mEq/L (ref 135–145)
Total Bilirubin: 0.3 mg/dL (ref 0.2–1.2)
Total Protein: 7.1 g/dL (ref 6.0–8.3)

## 2022-07-02 NOTE — Progress Notes (Signed)
As expected, some anemia from surgery.  Make sure drinking plenty of fluids.  Take iron  daily-may need to take stool softener daily as well.  If feeling worse before I see you next week, let us know

## 2022-07-03 DIAGNOSIS — M25662 Stiffness of left knee, not elsewhere classified: Secondary | ICD-10-CM | POA: Diagnosis not present

## 2022-07-03 DIAGNOSIS — M25562 Pain in left knee: Secondary | ICD-10-CM | POA: Diagnosis not present

## 2022-07-07 ENCOUNTER — Encounter: Payer: Self-pay | Admitting: Family Medicine

## 2022-07-07 ENCOUNTER — Ambulatory Visit (INDEPENDENT_AMBULATORY_CARE_PROVIDER_SITE_OTHER): Payer: No Typology Code available for payment source | Admitting: Family Medicine

## 2022-07-07 VITALS — BP 110/62 | HR 72 | Temp 98.7°F | Resp 16 | Ht 69.0 in | Wt 172.2 lb

## 2022-07-07 DIAGNOSIS — D500 Iron deficiency anemia secondary to blood loss (chronic): Secondary | ICD-10-CM | POA: Diagnosis not present

## 2022-07-07 DIAGNOSIS — Z13220 Encounter for screening for lipoid disorders: Secondary | ICD-10-CM

## 2022-07-07 DIAGNOSIS — Z0000 Encounter for general adult medical examination without abnormal findings: Secondary | ICD-10-CM

## 2022-07-07 NOTE — Patient Instructions (Signed)
It was very nice to see you today!  Bring copy of living will and pneumovax when get it   PLEASE NOTE:  If you had any lab tests please let us know if you have not heard back within a few days. You may see your results on MyChart before we have a chance to review them but we will give you a call once they are reviewed by [place]. If we ordered any referrals today, please let us know if you have not heard from their office within the next week.   Please try these tips to maintain a healthy lifestyle:  Eat most of your calories during the day when you are active. Eliminate processed foods including packaged sweets (pies, cakes, cookies), reduce intake of potatoes, white bread, white pasta, and white rice. Look for whole grain options, oat flour or almond flour.  Each meal should contain half fruits/vegetables, one quarter protein, and one quarter carbs (no bigger than a computer mouse).  Cut down on sweet beverages. This includes juice, soda, and sweet tea. Also watch fruit intake, though this is a healthier sweet option, it still contains natural sugar! Limit to 3 servings daily.  Drink at least 1 glass of water with each meal and aim for at least 8 glasses per day  Exercise at least 150 minutes every week.

## 2022-07-07 NOTE — Progress Notes (Signed)
Phone [phone]   Subjective:   Patient is a 67 y.o. female presenting for annual physical.    Chief Complaint  Patient presents with   Annual Exam    CPE Not fasting    Annual-will see orthopedic soon to release.  Declines pneumovax. Has living will. Some loose rugs.   Anemia-from knee surgery.  On iron.   See problem oriented charting- ROS- ROS: Gen: no fever, chills  Skin: no rash, itching ENT: no ear pain, ear drainage, nasal congestion, rhinorrhea, sinus pressure, sore throat Eyes: no blurry vision, double vision Resp: no cough, wheeze,SOB CV: no CP, palpitations, LE edema,  GI: no heartburn, n/v/d/c, abd pain-nausea resolved! GU: no dysuria, urgency, frequency, hematuria MSK: recovering from knee surgery Neuro: no dizziness, headache, weakness, vertigo Psych: no depression, anxiety, insomnia, SI   The following were reviewed and entered/updated in epic: Past Medical History:  Diagnosis Date   Arthritis    CIN II (cervical intraepithelial neoplasia II) 1994   Family history of adverse reaction to anesthesia    Mom nausea   GERD (gastroesophageal reflux disease)    Hx of adenomatous colonic polyps 07/17/2020   2 diminutive adenomas   Ringing of ears    Skin cancer    Eyelid   Patient Active Problem List   Diagnosis Date Noted   S/P total knee arthroplasty, 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   S/P arthroscopy of left knee 01/27/2022   Hx of adenomatous colonic polyps 07/17/2020   Past Surgical History:  Procedure Laterality Date   AUGMENTATION MAMMAPLASTY  2002   -saline   CERVICAL BIOPSY  W/ LOOP ELECTRODE EXCISION  1994   CIN II   CERVICAL CONE BIOPSY  1994   CESAREAN SECTION     x2, 1982, 1988   COLONOSCOPY  12/30/2006   [facility]    HYSTEROSCOPY  2000   resection of polyp   KNEE ARTHROSCOPY Right 2006   KNEE ARTHROSCOPY WITH MEDIAL MENISECTOMY Left 01/27/2022   Procedure: KNEE ARTHROSCOPY WITH MEDIAL MENISECTOMY, medial condoplasty;  Surgeon: Durene Romans, MD;   Location: [facility];  Service: Orthopedics;  Laterality: Left;   SKIN CANCER EXCISION     Eyelid   TOTAL KNEE ARTHROPLASTY Left 06/02/2022   Procedure: TOTAL KNEE ARTHROPLASTY;  Surgeon: Durene Romans, MD;  Location: [facility];  Service: Orthopedics;  Laterality: Left;   TUBAL LIGATION      Family History  Problem Relation Age of Onset   Osteoporosis Mother    Hypertension Mother    Kidney cancer Father        deceased   Heart attack Brother 44   Hypertension Brother    Hypertension Brother    Hypertension Maternal Grandmother    Heart attack Maternal Grandfather    Arthritis Paternal Grandmother    Colon cancer Neg Hx    Colon polyps Neg Hx    Esophageal cancer Neg Hx    Rectal cancer Neg Hx    Stomach cancer Neg Hx     Medications- reviewed and updated Current Outpatient Medications  Medication Sig Dispense Refill   Acetaminophen (TYLENOL PO) Take 500 mg by mouth at bedtime. 2 tablets at bedtime     Calcium Carb-Cholecalciferol (CALCIUM 500 + D3 PO) Take 2 each by mouth daily. Chewables (Patient not taking: Reported on 07/01/2022)     cetirizine (ZYRTEC) 5 MG tablet Take 5 mg by mouth daily. (Patient not taking: Reported on 07/01/2022)     EPINEPHrine 0.3 mg/0.3 mL IJ SOAJ injection Inject 0.3 mg  into the muscle once as needed (anaphylaxis/allergic reaction). (Patient not taking: Reported on 07/01/2022) 1 each 1   fluticasone (FLONASE) 50 MCG/ACT nasal spray Place 1 spray into both nostrils daily as needed for allergies. (Patient not taking: Reported on 07/07/2022)     Multiple Vitamins-Minerals (IMMUNE SUPPORT VITAMIN C PO) Take 1 tablet by mouth daily. (Patient not taking: Reported on 07/01/2022)     ondansetron (ZOFRAN-ODT) 4 MG disintegrating tablet TAKE 1 TABLET BY MOUTH EVERY 6 HOURS AS NEEDED FOR NAUSEA AND VOMITING (Patient not taking: Reported on 07/07/2022)     No current facility-administered medications for this visit.    Allergies-reviewed and updated Allergies  Allergen  Reactions   Other Anaphylaxis and Hives    Wheat straw   Black Cohosh     Headaches    Nitrofurantoin Hives and Other (See Comments)    Social History   Social History Narrative   5 grandsons   Retired-med tech   Objective  Objective:  BP 110/62 (BP Location: Left Arm, Patient Position: Sitting, Cuff Size: Normal)   Pulse 72   Temp 98.7 F (37.1 C) (Temporal)   Resp 16   Ht 5\' 9"  (1.753 m)   Wt 172 lb 4 oz (78.1 kg)   LMP 03/09/2004   SpO2 98%   BMI 25.44 kg/m  Physical Exam  Gen: [facility] NAD HEENT: NCAT, conjunctiva not injected, sclera nonicteric TM WNL B, OP moist, no exudates  NECK:  supple, no thyromegaly, no nodes, no carotid bruits CARDIAC: RRR, S1S2+, no murmur. DP 2+B LUNGS: CTAB. No wheezes ABDOMEN:  BS+, soft, NTND, No HSM, no masses EXT:  no edema MSK: no gross abnormalities. MS 5/5 all 4 NEURO: A&O x3.  CN II-XII intact.  PSYCH: normal mood. Good eye contact     Assessment and Plan   Health Maintenance counseling: 1. Anticipatory guidance: Patient counseled regarding regular dental exams q6 months, eye exams,  avoiding smoking and second hand smoke, limiting alcohol to 1 beverage per day, no illicit drugs.   2. Risk factor reduction:  Advised patient of need for regular exercise and diet rich and fruits and vegetables to reduce risk of heart attack and stroke. Exercise- will start.  Wt Readings from Last 3 Encounters:  07/07/22 172 lb 4 oz (78.1 kg)  07/01/22 174 lb (78.9 kg)  06/02/22 171 lb 15.3 oz (78 kg)   3. Immunizations/screenings/ancillary studies Immunization History  Administered Date(s) Administered   DTaP 11/08/2006   Influenza Inj Mdck Quad Pf 12/14/2018   Influenza, Seasonal, Injecte, Preservative Fre 01/24/2017, 12/31/2017, 01/20/2018   Influenza,inj,Quad PF,6+ Mos 12/31/2017, 12/14/2018, 12/20/2019   Influenza-Unspecified 01/24/2017, 12/31/2017, 12/31/2017, 01/20/2018, 12/29/2018   PFIZER(Purple Top)SARS-COV-2 Vaccination  05/24/2019, 06/14/2019, 02/14/2020   Tdap 10/23/2013   Zoster Recombinat (Shingrix) 12/31/2017, 03/04/2018   Health Maintenance Due  Topic Date Due   Medicare Annual Wellness (AWV)  Never done    4. Cervical cancer screening- next yr 5. Breast cancer screening-  mammogram utd 6. Colon cancer screening - utd 7. Skin cancer screening- advised regular sunscreen use. Denies worrisome, changing, or new skin lesions.  8. Birth control/STD check- n/a 9. Osteoporosis screening- n/a 10. Smoking associated screening - non smoker  Wellness examination  Iron deficiency anemia due to chronic blood loss -     CBC with Differential/Platelet; Future -     Iron, TIBC and Ferritin Panel; Future -     Vitamin B12; Future  Screening, lipid -     Lipid panel; Future  Annual-antic guidance.  Will get pneumovax w/flu.  Return for lipids Anemia-from surgery-take iron daily-reck 1 month(s).   Recommended follow up: Return in about 1 year (around 07/07/2023) for cpe/pap 1y,  labs 1 mo.  Lab/Order associations:retun fasting  Angelena Sole, MD

## 2022-07-09 DIAGNOSIS — H43823 Vitreomacular adhesion, bilateral: Secondary | ICD-10-CM | POA: Diagnosis not present

## 2022-07-09 DIAGNOSIS — H1789 Other corneal scars and opacities: Secondary | ICD-10-CM | POA: Diagnosis not present

## 2022-07-09 DIAGNOSIS — H538 Other visual disturbances: Secondary | ICD-10-CM | POA: Diagnosis not present

## 2022-07-09 DIAGNOSIS — H33102 Unspecified retinoschisis, left eye: Secondary | ICD-10-CM | POA: Diagnosis not present

## 2022-07-09 DIAGNOSIS — H25813 Combined forms of age-related cataract, bilateral: Secondary | ICD-10-CM | POA: Diagnosis not present

## 2022-07-15 DIAGNOSIS — H43823 Vitreomacular adhesion, bilateral: Secondary | ICD-10-CM | POA: Diagnosis not present

## 2022-07-15 DIAGNOSIS — H33192 Other retinoschisis and retinal cysts, left eye: Secondary | ICD-10-CM | POA: Diagnosis not present

## 2022-07-15 DIAGNOSIS — H25813 Combined forms of age-related cataract, bilateral: Secondary | ICD-10-CM | POA: Diagnosis not present

## 2022-07-15 DIAGNOSIS — H1789 Other corneal scars and opacities: Secondary | ICD-10-CM | POA: Diagnosis not present

## 2022-07-15 DIAGNOSIS — H43812 Vitreous degeneration, left eye: Secondary | ICD-10-CM | POA: Diagnosis not present

## 2022-07-16 DIAGNOSIS — M25662 Stiffness of left knee, not elsewhere classified: Secondary | ICD-10-CM | POA: Diagnosis not present

## 2022-07-16 DIAGNOSIS — M25562 Pain in left knee: Secondary | ICD-10-CM | POA: Diagnosis not present

## 2022-07-17 DIAGNOSIS — Z96652 Presence of left artificial knee joint: Secondary | ICD-10-CM | POA: Diagnosis not present

## 2022-07-17 DIAGNOSIS — Z471 Aftercare following joint replacement surgery: Secondary | ICD-10-CM | POA: Diagnosis not present

## 2022-07-29 DIAGNOSIS — M25562 Pain in left knee: Secondary | ICD-10-CM | POA: Diagnosis not present

## 2022-08-05 DIAGNOSIS — M25562 Pain in left knee: Secondary | ICD-10-CM | POA: Diagnosis not present

## 2022-08-06 ENCOUNTER — Other Ambulatory Visit (INDEPENDENT_AMBULATORY_CARE_PROVIDER_SITE_OTHER): Payer: No Typology Code available for payment source

## 2022-08-06 DIAGNOSIS — D500 Iron deficiency anemia secondary to blood loss (chronic): Secondary | ICD-10-CM | POA: Diagnosis not present

## 2022-08-06 DIAGNOSIS — Z13220 Encounter for screening for lipoid disorders: Secondary | ICD-10-CM | POA: Diagnosis not present

## 2022-08-06 LAB — LIPID PANEL
Cholesterol: 180 mg/dL (ref 0–200)
HDL: 46.1 mg/dL (ref >39.00)
LDL Cholesterol: 117 mg/dL — ABNORMAL HIGH (ref 0–99)
NonHDL: 133.71
Total CHOL/HDL Ratio: 4
Triglycerides: 85 mg/dL (ref 0.0–149.0)
VLDL: 17 mg/dL (ref 0.0–40.0)

## 2022-08-06 LAB — CBC WITH DIFFERENTIAL/PLATELET
Basophils Absolute: 0 10*3/uL (ref 0.0–0.1)
Basophils Relative: 0.5 % (ref 0.0–3.0)
Eosinophils Absolute: 0.1 10*3/uL (ref 0.0–0.7)
Eosinophils Relative: 2.5 % (ref 0.0–5.0)
HCT: 37.1 % (ref 36.0–46.0)
Hemoglobin: 12.2 g/dL (ref 12.0–15.0)
Lymphocytes Relative: 47.1 % — ABNORMAL HIGH (ref 12.0–46.0)
Lymphs Abs: 2.6 10*3/uL (ref 0.7–4.0)
MCHC: 32.8 g/dL (ref 30.0–36.0)
MCV: 93.7 fl (ref 78.0–100.0)
Monocytes Absolute: 0.4 10*3/uL (ref 0.1–1.0)
Monocytes Relative: 6.7 % (ref 3.0–12.0)
Neutro Abs: 2.4 10*3/uL (ref 1.4–7.7)
Neutrophils Relative %: 43.2 % (ref 43.0–77.0)
Platelets: 287 10*3/uL (ref 150.0–400.0)
RBC: 3.95 Mil/uL (ref 3.87–5.11)
RDW: 13.8 % (ref 11.5–15.5)
WBC: 5.5 10*3/uL (ref 4.0–10.5)

## 2022-08-06 LAB — VITAMIN B12: Vitamin B-12: 242 pg/mL (ref 211–911)

## 2022-08-07 LAB — IRON,TIBC AND FERRITIN PANEL
%SAT: 27 % (calc) (ref 16–45)
Ferritin: 53 ng/mL (ref 16–288)
Iron: 74 ug/dL (ref 45–160)
TIBC: 271 mcg/dL (calc) (ref 250–450)

## 2022-08-07 NOTE — Progress Notes (Signed)
Much better.  Can stop the iron.  B12 on low end.  Take B12 2000 mcg/day OTC (available over the counter without a prescription)

## 2022-08-12 DIAGNOSIS — M25662 Stiffness of left knee, not elsewhere classified: Secondary | ICD-10-CM | POA: Diagnosis not present

## 2022-08-12 DIAGNOSIS — M25562 Pain in left knee: Secondary | ICD-10-CM | POA: Diagnosis not present

## 2022-08-17 ENCOUNTER — Ambulatory Visit (INDEPENDENT_AMBULATORY_CARE_PROVIDER_SITE_OTHER): Payer: No Typology Code available for payment source

## 2022-08-17 VITALS — Wt 172.0 lb

## 2022-08-17 DIAGNOSIS — Z0000 Encounter for general adult medical examination without abnormal findings: Secondary | ICD-10-CM

## 2022-08-17 NOTE — Patient Instructions (Signed)
Ms. Sabrina Harper , Thank you for taking time to come for your Medicare Wellness Visit. I appreciate your ongoing commitment to your health goals. Please review the following plan we discussed and let me know if I can assist you in the future.   These are the goals we discussed:  Goals      Patient Stated     Get off OTC meds and stay active         This is a list of the screening recommended for you and due dates:  Health Maintenance  Topic Date Due   COVID-19 Vaccine (4 - 2023-24 season) 10/20/2022*   Pneumonia Vaccine (1 of 1 - PCV) 10/27/2022*   Flu Shot  10/08/2022   Medicare Annual Wellness Visit  08/17/2023   DTaP/Tdap/Td vaccine (3 - Td or Tdap) 10/24/2023   Mammogram  06/25/2024   Colon Cancer Screening  07/09/2027   DEXA scan (bone density measurement)  Completed   Hepatitis C Screening  Completed   Zoster (Shingles) Vaccine  Completed   HPV Vaccine  Aged Out  *Topic was postponed. The date shown is not the original due date.    Advanced directives: Please bring a copy of your health care power of attorney and living will to the office at your convenience.  Conditions/risks identified: stay active and get off OTC medications   Next appointment: Follow up in one year for your annual wellness visit    Preventive Care 65 Years and Older, Female Preventive care refers to lifestyle choices and visits with your health care provider that can promote health and wellness. What does preventive care include? A yearly physical exam. This is also called an annual well check. Dental exams once or twice a year. Routine eye exams. Ask your health care provider how often you should have your eyes checked. Personal lifestyle choices, including: Daily care of your teeth and gums. Regular physical activity. Eating a healthy diet. Avoiding tobacco and drug use. Limiting alcohol use. Practicing safe sex. Taking low-dose aspirin every day. Taking vitamin and mineral supplements as  recommended by your health care provider. What happens during an annual well check? The services and screenings done by your health care provider during your annual well check will depend on your age, overall health, lifestyle risk factors, and family history of disease. Counseling  Your health care provider may ask you questions about your: Alcohol use. Tobacco use. Drug use. Emotional well-being. Home and relationship well-being. Sexual activity. Eating habits. History of falls. Memory and ability to understand (cognition). Work and work Astronomer. Reproductive health. Screening  You may have the following tests or measurements: Height, weight, and BMI. Blood pressure. Lipid and cholesterol levels. These may be checked every 5 years, or more frequently if you are over 68 years old. Skin check. Lung cancer screening. You may have this screening every year starting at age 67 if you have a 30-pack-year history of smoking and currently smoke or have quit within the past 15 years. Fecal occult blood test (FOBT) of the stool. You may have this test every year starting at age 97. Flexible sigmoidoscopy or colonoscopy. You may have a sigmoidoscopy every 5 years or a colonoscopy every 10 years starting at age 61. Hepatitis C blood test. Hepatitis B blood test. Sexually transmitted disease (STD) testing. Diabetes screening. This is done by checking your blood sugar (glucose) after you have not eaten for a while (fasting). You may have this done every 1-3 years. Bone density scan. This  is done to screen for osteoporosis. You may have this done starting at age 48. Mammogram. This may be done every 1-2 years. Talk to your health care provider about how often you should have regular mammograms. Talk with your health care provider about your test results, treatment options, and if necessary, the need for more tests. Vaccines  Your health care provider may recommend certain vaccines, such  as: Influenza vaccine. This is recommended every year. Tetanus, diphtheria, and acellular pertussis (Tdap, Td) vaccine. You may need a Td booster every 10 years. Zoster vaccine. You may need this after age 64. Pneumococcal 13-valent conjugate (PCV13) vaccine. One dose is recommended after age 22. Pneumococcal polysaccharide (PPSV23) vaccine. One dose is recommended after age 79. Talk to your health care provider about which screenings and vaccines you need and how often you need them. This information is not intended to replace advice given to you by your health care provider. Make sure you discuss any questions you have with your health care provider. Document Released: 03/22/2015 Document Revised: 11/13/2015 Document Reviewed: 12/25/2014 Elsevier Interactive Patient Education  2017 [employer].  Fall Prevention in the Home Falls can cause injuries. They can happen to people of all ages. There are many things you can do to make your home safe and to help prevent falls. What can I do on the outside of my home? Regularly fix the edges of walkways and driveways and fix any cracks. Remove anything that might make you trip as you walk through a door, such as a raised step or threshold. Trim any bushes or trees on the path to your home. Use bright outdoor lighting. Clear any walking paths of anything that might make someone trip, such as rocks or tools. Regularly check to see if handrails are loose or broken. Make sure that both sides of any steps have handrails. Any raised decks and porches should have guardrails on the edges. Have any leaves, snow, or ice cleared regularly. Use sand or salt on walking paths during winter. Clean up any spills in your garage right away. This includes oil or grease spills. What can I do in the bathroom? Use night lights. Install grab bars by the toilet and in the tub and shower. Do not use towel bars as grab bars. Use non-skid mats or decals in the tub or  shower. If you need to sit down in the shower, use a plastic, non-slip stool. Keep the floor dry. Clean up any water that spills on the floor as soon as it happens. Remove soap buildup in the tub or shower regularly. Attach bath mats securely with double-sided non-slip rug tape. Do not have throw rugs and other things on the floor that can make you trip. What can I do in the bedroom? Use night lights. Make sure that you have a light by your bed that is easy to reach. Do not use any sheets or blankets that are too big for your bed. They should not hang down onto the floor. Have a firm chair that has side arms. You can use this for support while you get dressed. Do not have throw rugs and other things on the floor that can make you trip. What can I do in the kitchen? Clean up any spills right away. Avoid walking on wet floors. Keep items that you use a lot in easy-to-reach places. If you need to reach something above you, use a strong step stool that has a grab bar. Keep electrical cords out  of the way. Do not use floor polish or wax that makes floors slippery. If you must use wax, use non-skid floor wax. Do not have throw rugs and other things on the floor that can make you trip. What can I do with my stairs? Do not leave any items on the stairs. Make sure that there are handrails on both sides of the stairs and use them. Fix handrails that are broken or loose. Make sure that handrails are as long as the stairways. Check any carpeting to make sure that it is firmly attached to the stairs. Fix any carpet that is loose or worn. Avoid having throw rugs at the top or bottom of the stairs. If you do have throw rugs, attach them to the floor with carpet tape. Make sure that you have a light switch at the top of the stairs and the bottom of the stairs. If you do not have them, ask someone to add them for you. What else can I do to help prevent falls? Wear shoes that: Do not have high heels. Have  rubber bottoms. Are comfortable and fit you well. Are closed at the toe. Do not wear sandals. If you use a stepladder: Make sure that it is fully opened. Do not climb a closed stepladder. Make sure that both sides of the stepladder are locked into place. Ask someone to hold it for you, if possible. Clearly mark and make sure that you can see: Any grab bars or handrails. First and last steps. Where the edge of each step is. Use tools that help you move around (mobility aids) if they are needed. These include: Canes. Walkers. Scooters. Crutches. Turn on the lights when you go into a dark area. Replace any light bulbs as soon as they burn out. Set up your furniture so you have a clear path. Avoid moving your furniture around. If any of your floors are uneven, fix them. If there are any pets around you, be aware of where they are. Review your medicines with your doctor. Some medicines can make you feel dizzy. This can increase your chance of falling. Ask your doctor what other things that you can do to help prevent falls. This information is not intended to replace advice given to you by your health care provider. Make sure you discuss any questions you have with your health care provider. Document Released: 12/20/2008 Document Revised: 08/01/2015 Document Reviewed: 03/30/2014 Elsevier Interactive Patient Education  2017 [employer].

## 2022-08-17 NOTE — Progress Notes (Signed)
I connected with  Sabrina Harper on 08/17/22 by a audio enabled telemedicine application and verified that I am speaking with the correct person using two identifiers.  Patient Location: Home  Provider Location: Office/Clinic  I discussed the limitations of evaluation and management by telemedicine. The patient expressed understanding and agreed to proceed.   Subjective:   Sabrina Harper is a 67 y.o. female who presents for an Initial Medicare Annual Wellness Visit.  Review of Systems     Cardiac Risk Factors include: advanced age (>54men, >86 women)     Objective:    Today's Vitals   08/17/22 0830  Weight: 172 lb (78 kg)   Body mass index is 25.4 kg/m.     08/17/2022    8:40 AM 06/02/2022    7:55 AM 05/22/2022    9:33 AM 01/27/2022    5:40 AM 01/15/2022    9:13 AM  Advanced Directives  Does Patient Have a Medical Advance Directive? Yes Yes Yes Yes Yes  Type of Estate agent of [place];Living will Healthcare Power of [place];Living will Living will;Healthcare Power of [employer] Power of [employer] Power of [place];Living will  Does patient want to make changes to medical advance directive?  No - Patient declined  No - Patient declined   Copy of Healthcare Power of Attorney in Chart? No - copy requested No - copy requested  No - copy requested No - copy requested    Current Medications (verified) Outpatient Encounter Medications as of 08/17/2022  Medication Sig   Acetaminophen (TYLENOL PO) Take 500 mg by mouth at bedtime. 2 tablets at bedtime   EPINEPHrine 0.3 mg/0.3 mL IJ SOAJ injection Inject 0.3 mg into the muscle once as needed (anaphylaxis/allergic reaction).   [DISCONTINUED] Calcium Carb-Cholecalciferol (CALCIUM 500 + D3 PO) Take 2 each by mouth daily. Chewables (Patient not taking: Reported on 07/01/2022)   [DISCONTINUED] cetirizine (ZYRTEC) 5 MG tablet Take 5 mg by mouth daily. (Patient not taking: Reported on 07/01/2022)    [DISCONTINUED] fluticasone (FLONASE) 50 MCG/ACT nasal spray Place 1 spray into both nostrils daily as needed for allergies. (Patient not taking: Reported on 07/07/2022)   [DISCONTINUED] Multiple Vitamins-Minerals (IMMUNE SUPPORT VITAMIN C PO) Take 1 tablet by mouth daily. (Patient not taking: Reported on 07/01/2022)   [DISCONTINUED] ondansetron (ZOFRAN-ODT) 4 MG disintegrating tablet TAKE 1 TABLET BY MOUTH EVERY 6 HOURS AS NEEDED FOR NAUSEA AND VOMITING (Patient not taking: Reported on 07/07/2022)   No facility-administered encounter medications on file as of 08/17/2022.    Allergies (verified) Other, Black cohosh, and Nitrofurantoin   History: Past Medical History:  Diagnosis Date   Arthritis    CIN II (cervical intraepithelial neoplasia II) 1994   Family history of adverse reaction to anesthesia    Mom nausea   GERD (gastroesophageal reflux disease)    Hx of adenomatous colonic polyps 07/17/2020   2 diminutive adenomas   Ringing of ears    Skin cancer    Eyelid   Past Surgical History:  Procedure Laterality Date   AUGMENTATION MAMMAPLASTY  2002   -saline   CERVICAL BIOPSY  W/ LOOP ELECTRODE EXCISION  1994   CIN II   CERVICAL CONE BIOPSY  1994   CESAREAN SECTION     x2, 1982, 1988   COLONOSCOPY  12/30/2006   [facility]    HYSTEROSCOPY  2000   resection of polyp   KNEE ARTHROSCOPY Right 2006   KNEE ARTHROSCOPY WITH MEDIAL MENISECTOMY Left 01/27/2022   Procedure:  KNEE ARTHROSCOPY WITH MEDIAL MENISECTOMY, medial condoplasty;  Surgeon: Durene Romans, MD;  Location: [facility];  Service: Orthopedics;  Laterality: Left;   SKIN CANCER EXCISION     Eyelid   TOTAL KNEE ARTHROPLASTY Left 06/02/2022   Procedure: TOTAL KNEE ARTHROPLASTY;  Surgeon: Durene Romans, MD;  Location: [facility];  Service: Orthopedics;  Laterality: Left;   TUBAL LIGATION     Family History  Problem Relation Age of Onset   Osteoporosis Mother    Hypertension Mother    Kidney cancer Father        deceased   Heart  attack Brother 73   Hypertension Brother    Hypertension Brother    Hypertension Maternal Grandmother    Heart attack Maternal Grandfather    Arthritis Paternal Grandmother    Colon cancer Neg Hx    Colon polyps Neg Hx    Esophageal cancer Neg Hx    Rectal cancer Neg Hx    Stomach cancer Neg Hx    Social History   Socioeconomic History   Marital status: Married    Spouse name: Not on file   Number of children: 3   Years of education: Not on file   Highest education level: Not on file  Occupational History   Not on file  Tobacco Use   Smoking status: Never   Smokeless tobacco: Never  Vaping Use   Vaping Use: Never used  Substance and Sexual Activity   Alcohol use: Yes    Comment: wine socially   Drug use: Never   Sexual activity: Yes    Partners: Male    Birth control/protection: Surgical    Comment: Tubal  Other Topics Concern   Not on file  Social History Narrative   5 grandsons   Retired-med [employer]   Social Determinants of Health   Financial Resource Strain: Low Risk  (08/17/2022)   Overall Financial Resource Strain (CARDIA)    Difficulty of Paying Living Expenses: Not hard at all  Food Insecurity: No Food Insecurity (08/17/2022)   Hunger Vital Sign    Worried About Running Out of Food in the Last Year: Never true    Ran Out of Food in the Last Year: Never true  Transportation Needs: No Transportation Needs (08/17/2022)   PRAPARE - Administrator, Civil Service (Medical): No    Lack of Transportation (Non-Medical): No  Physical Activity: Inactive (08/17/2022)   Exercise Vital Sign    Days of Exercise per Week: 0 days    Minutes of Exercise per Session: 0 min  Stress: No Stress Concern Present (08/17/2022)   [employer] of Occupational Health - Occupational Stress Questionnaire    Feeling of Stress : Not at all  Social Connections: Moderately Integrated (08/17/2022)   Social Connection and Isolation Panel [NHANES]    Frequency of Communication  with Friends and Family: More than three times a week    Frequency of Social Gatherings with Friends and Family: More than three times a week    Attends Religious Services: More than 4 times per year    Active Member of [employer] or Organizations: No    Attends Banker Meetings: Never    Marital Status: Married    Tobacco Counseling Counseling given: Not Answered   Clinical Intake:  Pre-visit preparation completed: Yes  Pain : No/denies pain     BMI - recorded: 25.4 Nutritional Status: BMI 25 -29 Overweight Nutritional Risks: None Diabetes: No  How often do you need to  have someone help you when you read instructions, pamphlets, or other written materials from your doctor or pharmacy?: 1 - Never  Diabetic?no  Interpreter Needed?: No  Information entered by :: Lanier Ensign, LPN   Activities of Daily Living    08/17/2022    8:41 AM 05/22/2022    9:36 AM  In your present state of health, do you have any difficulty performing the following activities:  Hearing? 0   Vision? 0   Difficulty concentrating or making decisions? 0   Walking or climbing stairs? 0   Dressing or bathing? 0   Doing errands, shopping? 0 0  Preparing Food and eating ? N   Using the Toilet? N   In the past six months, have you accidently leaked urine? N   Do you have problems with loss of bowel control? N   Managing your Medications? N   Managing your Finances? N   Housekeeping or managing your Housekeeping? N     Patient Care Team: Jeani Sow, MD as PCP - General (Family Medicine)  Indicate any recent Medical Services you may have received from other than [facility] providers in the past year (date may be approximate).     Assessment:   This is a routine wellness examination for Yanitza.  Hearing/Vision screen Hearing Screening - Comments:: Pt denies any hearing issue  Vision Screening - Comments:: Pt follows up with Dr Velna Ochs for annual eye exams   Dietary issues and  exercise activities discussed: Current Exercise Habits: The patient does not participate in regular exercise at present   Goals Addressed             This Visit's Progress    Patient Stated       Get off OTC meds and stay active        Depression Screen    08/17/2022    8:37 AM 05/14/2022   10:51 AM 06/12/2021   10:04 AM  PHQ 2/9 Scores  PHQ - 2 Score 0 0 0  PHQ- 9 Score  1 1    Fall Risk    08/17/2022    8:41 AM 05/14/2022   10:51 AM 06/12/2021    9:52 AM  Fall Risk   Falls in the past year? 0 0 0  Number falls in past yr: 0 0 0  Injury with Fall? 0 0 0  Risk for fall due to : Impaired vision No Fall Risks   Follow up Falls prevention discussed Falls evaluation completed     FALL RISK PREVENTION PERTAINING TO THE HOME:  Any stairs in or around the home? Yes  If so, are there any without handrails? No  Home free of loose throw rugs in walkways, pet beds, electrical cords, etc? Yes  Adequate lighting in your home to reduce risk of falls? Yes   ASSISTIVE DEVICES UTILIZED TO PREVENT FALLS:  Life alert? No  Use of a cane, walker or w/c? No  Grab bars in the bathroom? No  Shower chair or bench in shower? Yes  Elevated toilet seat or a handicapped toilet? No   TIMED UP AND GO:  Was the test performed? No .   Cognitive Function:        08/17/2022    8:41 AM  6CIT Screen  What Year? 0 points  What month? 0 points  What time? 0 points  Count back from 20 0 points  Months in reverse 0 points  Repeat phrase 0 points  Total Score  0 points    Immunizations Immunization History  Administered Date(s) Administered   DTaP 11/08/2006   Influenza Inj Mdck Quad Pf 12/14/2018   Influenza, Seasonal, Injecte, Preservative Fre 01/24/2017, 12/31/2017, 01/20/2018   Influenza,inj,Quad PF,6+ Mos 12/31/2017, 12/14/2018, 12/20/2019   Influenza-Unspecified 01/24/2017, 12/31/2017, 12/31/2017, 01/20/2018, 12/29/2018   PFIZER(Purple Top)SARS-COV-2 Vaccination 05/24/2019,  06/14/2019, 02/14/2020   Tdap 10/23/2013   Zoster Recombinat (Shingrix) 12/31/2017, 03/04/2018    TDAP status: Up to date  Flu Vaccine status: Due, Education has been provided regarding the importance of this vaccine. Advised may receive this vaccine at local pharmacy or Health Dept. Aware to provide a copy of the vaccination record if obtained from local pharmacy or Health Dept. Verbalized acceptance and understanding.  Pneumococcal vaccine status: Due, Education has been provided regarding the importance of this vaccine. Advised may receive this vaccine at local pharmacy or Health Dept. Aware to provide a copy of the vaccination record if obtained from local pharmacy or Health Dept. Verbalized acceptance and understanding.  Covid-19 vaccine status: Completed vaccines  Qualifies for Shingles Vaccine? Yes   Zostavax completed Yes   Shingrix Completed?: Yes  Screening Tests Health Maintenance  Topic Date Due   COVID-19 Vaccine (4 - 2023-24 season) 10/20/2022 (Originally 11/07/2021)   Pneumonia Vaccine 106+ Years old (1 of 1 - PCV) 10/27/2022 (Originally 06/21/2021)   INFLUENZA VACCINE  10/08/2022   Medicare Annual Wellness (AWV)  08/17/2023   DTaP/Tdap/Td (3 - Td or Tdap) 10/24/2023   MAMMOGRAM  06/25/2024   Colonoscopy  07/09/2027   DEXA SCAN  Completed   Hepatitis C Screening  Completed   Zoster Vaccines- Shingrix  Completed   HPV VACCINES  Aged Out    Health Maintenance  There are no preventive care reminders to display for this patient.   Colorectal cancer screening: Type of screening: Colonoscopy. Completed 07/08/20. Repeat every 7 years  Mammogram status: Completed 06/26/22. Repeat every year  Bone Density status: Completed 04/30/17. Results reflect: Bone density results: NORMAL. Repeat every 2 years.   Additional Screening:  Hepatitis C Screening:  Completed 02/26/16  Vision Screening: Recommended annual ophthalmology exams for early detection of glaucoma and other  disorders of the eye. Is the patient up to date with their annual eye exam?  Yes  Who is the provider or what is the name of the office in which the patient attends annual eye exams? Dr Lucretia Roers  If pt is not established with a provider, would they like to be referred to a provider to establish care? No .   Dental Screening: Recommended annual dental exams for proper oral hygiene  Community Resource Referral / Chronic Care Management: CRR required this visit?  No   CCM required this visit?  No      Plan:     I have personally reviewed and noted the following in the patient's chart:   Medical and social history Use of alcohol, tobacco or illicit drugs  Current medications and supplements including opioid prescriptions. Patient is not currently taking opioid prescriptions. Functional ability and status Nutritional status Physical activity Advanced directives List of other physicians Hospitalizations, surgeries, and ER visits in previous 12 months Vitals Screenings to include cognitive, depression, and falls Referrals and appointments  In addition, I have reviewed and discussed with patient certain preventive protocols, quality metrics, and best practice recommendations. A written personalized care plan for preventive services as well as general preventive health recommendations were provided to patient.     Marzella Schlein, LPN  08/17/2022   Nurse Notes: none

## 2022-08-19 DIAGNOSIS — M25662 Stiffness of left knee, not elsewhere classified: Secondary | ICD-10-CM | POA: Diagnosis not present

## 2022-08-19 DIAGNOSIS — M25562 Pain in left knee: Secondary | ICD-10-CM | POA: Diagnosis not present

## 2022-08-24 DIAGNOSIS — D2239 Melanocytic nevi of other parts of face: Secondary | ICD-10-CM | POA: Diagnosis not present

## 2022-08-24 DIAGNOSIS — D2272 Melanocytic nevi of left lower limb, including hip: Secondary | ICD-10-CM | POA: Diagnosis not present

## 2022-08-24 DIAGNOSIS — D2262 Melanocytic nevi of left upper limb, including shoulder: Secondary | ICD-10-CM | POA: Diagnosis not present

## 2022-08-24 DIAGNOSIS — D235 Other benign neoplasm of skin of trunk: Secondary | ICD-10-CM | POA: Diagnosis not present

## 2022-08-24 DIAGNOSIS — Z85828 Personal history of other malignant neoplasm of skin: Secondary | ICD-10-CM | POA: Diagnosis not present

## 2022-08-24 DIAGNOSIS — L821 Other seborrheic keratosis: Secondary | ICD-10-CM | POA: Diagnosis not present

## 2022-08-24 DIAGNOSIS — D225 Melanocytic nevi of trunk: Secondary | ICD-10-CM | POA: Diagnosis not present

## 2022-08-24 DIAGNOSIS — D2271 Melanocytic nevi of right lower limb, including hip: Secondary | ICD-10-CM | POA: Diagnosis not present

## 2022-09-04 DIAGNOSIS — H33192 Other retinoschisis and retinal cysts, left eye: Secondary | ICD-10-CM | POA: Diagnosis not present

## 2022-09-04 DIAGNOSIS — H43812 Vitreous degeneration, left eye: Secondary | ICD-10-CM | POA: Diagnosis not present

## 2022-09-04 DIAGNOSIS — H43823 Vitreomacular adhesion, bilateral: Secondary | ICD-10-CM | POA: Diagnosis not present

## 2022-11-11 DIAGNOSIS — M48062 Spinal stenosis, lumbar region with neurogenic claudication: Secondary | ICD-10-CM | POA: Diagnosis not present

## 2022-11-11 DIAGNOSIS — M1711 Unilateral primary osteoarthritis, right knee: Secondary | ICD-10-CM | POA: Diagnosis not present

## 2022-11-11 DIAGNOSIS — M5136 Other intervertebral disc degeneration, lumbar region: Secondary | ICD-10-CM | POA: Diagnosis not present

## 2022-11-11 DIAGNOSIS — Z96652 Presence of left artificial knee joint: Secondary | ICD-10-CM | POA: Diagnosis not present

## 2022-12-24 ENCOUNTER — Encounter: Payer: Self-pay | Admitting: Podiatrist

## 2022-12-24 ENCOUNTER — Ambulatory Visit (INDEPENDENT_AMBULATORY_CARE_PROVIDER_SITE_OTHER): Payer: No Typology Code available for payment source | Admitting: Podiatrist

## 2022-12-24 DIAGNOSIS — L600 Ingrowing nail: Secondary | ICD-10-CM

## 2022-12-24 NOTE — Patient Instructions (Signed)
Massage the end of your toe with a neosporin/ vaseline type medication to massage the skin away from the corners of the nail  Epsome salts are also great for soaking.  Call if the nail becomes more uncomfortble and you would like the corners removed.

## 2022-12-24 NOTE — Progress Notes (Signed)
  Chief Complaint  Patient presents with   Nail Problem    RIGHT HALLUX NAIL IS CAUSING SO PAIN WHEN SHE PRESSES ON THE NAIL AND THE MIDDLE OF THE NAIL IS LIFTING UP      HPI: Patient is 67 y.o. female who presents today for discomfort medial and lateral borders of the right hallux nail.  She has had multiple injuries of this nail in the past and over time has developed a central area of the nail that is not attached.  The corners are becoming uncomfortable.   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 2 tablets at bedtime     EPINEPHrine 0.3 mg/0.3 mL IJ SOAJ injection Inject 0.3 mg into the muscle once as needed (anaphylaxis/allergic reaction). 1 each 1   No current facility-administered medications on file prior to visit.    Allergies  Allergen Reactions   Other Anaphylaxis and Hives    Wheat straw   Black Cohosh     Headaches    Nitrofurantoin Hives and Other (See Comments)    Review of Systems No fevers, chills, nausea, muscle aches, no difficulty breathing, no calf pain, no chest pain or shortness of breath.   Physical Exam  GENERAL APPEARANCE: Alert, conversant. Appropriately groomed. No acute distress.   VASCULAR: Pedal pulses palpable 2/4 DP and PT bilateral.  Capillary refill time is immediate to all digits,  Proximal to distal cooling is warm to warm.  Digital perfusion adequate.   NEUROLOGIC: sensation is intact to 5.07 monofilament at 5/5 sites bilateral.    MUSCULOSKELETAL: acceptable muscle strength, tone and stability bilateral.  No gross boney pedal deformities noted.    DERMATOLOGIC: skin is warm, supple, and dry.  Color, texture, and turgor of skin within normal  limits.    Right hallux nail has a central portion of the nail that is unattached.  The medial and lateral corners are starting to pinch causing some discomfort with direct pressure.      Assessment     ICD-10-CM   1. Ingrown right greater toenail  L60.0        Plan  Discussed treatment options and recommendations.  There is no sign of infection, and the discomfort is mild to moderate.  Discussed massaging the corners of the nail to pull the skin away from the nail to try and decrease the discomfort and allow time for the nail to grow to see if once the central area attaches, if the nail will continue to pinch.  Also discussed removing the corners permanently.  At this time she will try conservative options and will call if this fails to relieve her pain for a permanent matrixectomy of the medial and lateral borders of the right great toenail.

## 2023-06-08 DIAGNOSIS — Z6826 Body mass index (BMI) 26.0-26.9, adult: Secondary | ICD-10-CM | POA: Diagnosis not present

## 2023-06-08 DIAGNOSIS — Z008 Encounter for other general examination: Secondary | ICD-10-CM | POA: Diagnosis not present

## 2023-06-08 DIAGNOSIS — M1990 Unspecified osteoarthritis, unspecified site: Secondary | ICD-10-CM | POA: Diagnosis not present

## 2023-06-08 DIAGNOSIS — E663 Overweight: Secondary | ICD-10-CM | POA: Diagnosis not present

## 2023-07-02 DIAGNOSIS — Z1231 Encounter for screening mammogram for malignant neoplasm of breast: Secondary | ICD-10-CM | POA: Diagnosis not present

## 2023-07-02 LAB — HM MAMMOGRAPHY

## 2023-07-04 ENCOUNTER — Encounter: Payer: Self-pay | Admitting: Family Medicine

## 2023-07-06 DIAGNOSIS — H52223 Regular astigmatism, bilateral: Secondary | ICD-10-CM | POA: Diagnosis not present

## 2023-07-06 DIAGNOSIS — H1849 Other corneal degeneration: Secondary | ICD-10-CM | POA: Diagnosis not present

## 2023-07-06 DIAGNOSIS — H2513 Age-related nuclear cataract, bilateral: Secondary | ICD-10-CM | POA: Diagnosis not present

## 2023-07-08 ENCOUNTER — Other Ambulatory Visit (HOSPITAL_COMMUNITY)
Admission: RE | Admit: 2023-07-08 | Discharge: 2023-07-08 | Disposition: A | Discharge status: Home / Self Care | Source: Ambulatory Visit | Attending: Family Medicine | Admitting: Family Medicine

## 2023-07-08 ENCOUNTER — Ambulatory Visit: Payer: No Typology Code available for payment source | Admitting: Family Medicine

## 2023-07-08 ENCOUNTER — Encounter: Payer: Self-pay | Admitting: Family Medicine

## 2023-07-08 VITALS — BP 127/77 | HR 66 | Temp 98.1°F | Resp 16 | Ht 69.0 in | Wt 175.5 lb

## 2023-07-08 DIAGNOSIS — Z01419 Encounter for gynecological examination (general) (routine) without abnormal findings: Secondary | ICD-10-CM | POA: Insufficient documentation

## 2023-07-08 DIAGNOSIS — Z124 Encounter for screening for malignant neoplasm of cervix: Secondary | ICD-10-CM

## 2023-07-08 NOTE — Progress Notes (Signed)
 Phone [phone]   Subjective:   Patient is a 68 y.o. female presenting for annual physical.    Chief Complaint  Patient presents with   Annual Exam    CPE with pap Has not had any food, had mushroom coffee    Gynecologic Exam   Annual-D,ca, vits,mg,fish oil.  Mineral salt water.  Exercises.  R knee rep in Fall-will do.  Cataracts soon  See problem oriented charting- ROS- ROS: Gen: no fever, chills  Skin: no rash, itching ENT: no ear pain, ear drainage, nasal congestion, rhinorrhea, sinus pressure, sore throat Eyes: no blurry vision, double vision Resp: no cough, wheeze,SOB CV: no CP, palpitations, LE edema,  GI: no heartburn, n/v/d/c, abd pain GU: no dysuria, urgency, frequency, hematuria MSK: R knee Neuro: no dizziness, headache, weakness, vertigo Psych: no depression, anxiety, insomnia, SI   The following were reviewed and entered/updated in epic: Past Medical History:  Diagnosis Date   Arthritis    CIN II (cervical intraepithelial neoplasia II) 1994   Family history of adverse reaction to anesthesia    Mom nausea   GERD (gastroesophageal reflux disease)    Hx of adenomatous colonic polyps 07/17/2020   2 diminutive adenomas   Ringing of ears    Skin cancer    Eyelid   Patient Active Problem List   Diagnosis Date Noted   S/P total knee arthroplasty, 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   S/P arthroscopy of left knee 01/27/2022   Hx of adenomatous colonic polyps 07/17/2020   Past Surgical History:  Procedure Laterality Date   AUGMENTATION MAMMAPLASTY  2002   -saline   CERVICAL BIOPSY  W/ LOOP ELECTRODE EXCISION  1994   CIN II   CERVICAL CONE BIOPSY  1994   CESAREAN SECTION     x2, 1982, 1988   COLONOSCOPY  12/30/2006   [facility]    HYSTEROSCOPY  2000   resection of polyp   KNEE ARTHROSCOPY Right 2006   KNEE ARTHROSCOPY WITH MEDIAL MENISECTOMY Left 01/27/2022   Procedure: KNEE ARTHROSCOPY WITH MEDIAL MENISECTOMY, medial condoplasty;  Surgeon: Claiborne Crew, MD;  Location:  [facility];  Service: Orthopedics;  Laterality: Left;   SKIN CANCER EXCISION     Eyelid   TOTAL KNEE ARTHROPLASTY Left 06/02/2022   Procedure: TOTAL KNEE ARTHROPLASTY;  Surgeon: Claiborne Crew, MD;  Location: [facility];  Service: Orthopedics;  Laterality: Left;   TUBAL LIGATION      Family History  Problem Relation Age of Onset   Osteoporosis Mother    Hypertension Mother    Kidney cancer Father        deceased   Heart attack Brother 67   Hypertension Brother    Hypertension Brother    Hypertension Maternal Grandmother    Heart attack Maternal Grandfather    Arthritis Paternal Grandmother    Colon cancer Neg Hx    Colon polyps Neg Hx    Esophageal cancer Neg Hx    Rectal cancer Neg Hx    Stomach cancer Neg Hx     Medications- reviewed and updated Current Outpatient Medications  Medication Sig Dispense Refill   Acetaminophen  (TYLENOL  PO) Take 500 mg by mouth at bedtime. 2 tablets at bedtime     EPINEPHrine  0.3 mg/0.3 mL IJ SOAJ injection Inject 0.3 mg into the muscle once as needed (anaphylaxis/allergic reaction). 1 each 1   No current facility-administered medications for this visit.    Allergies-reviewed and updated Allergies  Allergen Reactions   Other Anaphylaxis and Hives    Wheat straw  Black Cohosh     Headaches    Nitrofurantoin Hives and Other (See Comments)    Social History   Social History Narrative   5 grandsons   Retired-med tech   Objective  Objective:  BP 127/77   Pulse 66   Temp 98.1 F (36.7 C) (Temporal)   Resp 16   Ht 5\' 9"  (1.753 m)   Wt 175 lb 8 oz (79.6 kg)   LMP 03/09/2004   SpO2 98%   BMI 25.92 kg/m  Physical Exam  Gen: WDWN NAD HEENT: NCAT, conjunctiva not injected, sclera nonicteric TM WNL B, OP moist, no exudates  NECK:  supple, no thyromegaly, no nodes, no carotid bruits CARDIAC: RRR, S1S2+, no murmur. DP 2+B LUNGS: CTAB. No wheezes ABDOMEN:  BS+, soft, NTND, No HSM, no masses EXT:  no edema MSK: no gross abnormalities. MS  5/5 all 4 NEURO: A&O x3.  CN II-XII intact.  PSYCH: normal mood. Good eye contact   Breasts: Examined lying and sitting.              Right:   Without masses, retractions,  nipple discharge or axillary adenopathy.               Left:     Without masses, retractions, nipple discharge or axillary adenopathy. Genitourinary              Inguinal/mons:  Normal without inguinal adenopathy             External genitalia:  Normal appearing vulva with no masses, tenderness, or lesions             BUS/Urethra/Skene's glands:  Normal             Vagina:  Normal appearing with normal color and discharge, no lesions             Cervix:  Normal appearing without discharge or lesions-stenotic             Uterus:  Normal in size, shape and contour.  Midline and mobile, nontender             Adnexa/parametria:                           Rt:        Normal in size, without masses or tenderness.                         Lt:        Normal in size, without masses or tenderness.             Anus and perineum: Normal  Chaperone present qj    Assessment and Plan   Health Maintenance counseling: 1. Anticipatory guidance: Patient counseled regarding regular dental exams q6 months, eye exams,  avoiding smoking and second hand smoke, limiting alcohol to 1 beverage per day, no illicit drugs.   2. Risk factor reduction:  Advised patient of need for regular exercise and diet rich and fruits and vegetables to reduce risk of heart attack and stroke. Exercise- +.  Wt Readings from Last 3 Encounters:  07/08/23 175 lb 8 oz (79.6 kg)  08/17/22 172 lb (78 kg)  07/07/22 172 lb 4 oz (78.1 kg)   3. Immunizations/screenings/ancillary studies Immunization History  Administered Date(s) Administered   DTaP 11/08/2006   Influenza Inj Mdck Quad Pf 12/14/2018   Influenza, Seasonal, Injecte, Preservative Fre 01/24/2017, 12/31/2017, 01/20/2018  Influenza,inj,Quad PF,6+ Mos 12/31/2017, 12/14/2018, 12/20/2019    Influenza-Unspecified 01/24/2017, 12/31/2017, 12/31/2017, 01/20/2018, 12/29/2018   PFIZER(Purple Top)SARS-COV-2 Vaccination 05/24/2019, 06/14/2019, 02/14/2020   Tdap 10/23/2013   Zoster Recombinant(Shingrix) 12/31/2017, 03/04/2018   Health Maintenance Due  Topic Date Due   Medicare Annual Wellness (AWV)  08/17/2023    4. Cervical cancer screening- done 5. Breast cancer screening-  mammogram utd 6. Colon cancer screening - utd 7. Skin cancer screening- advised regular sunscreen use. Denies worrisome, changing, or new skin lesions.  8. Birth control/STD check- n/a 9. Osteoporosis screening- utd 10. Smoking associated screening - non smoker  Well woman exam with routine gynecological exam -     Cytology - PAP  Screening for cervical cancer -     Cytology - PAP   Well woman exam.  Pap done. Antic guidance.    Recommended follow up: Return in about 1 year (around 07/07/2024) for annual physical.    .  Lab/Order associations:n/a fasting  Ellsworth Haas, MD

## 2023-07-08 NOTE — Patient Instructions (Signed)

## 2023-07-14 ENCOUNTER — Encounter: Payer: Self-pay | Admitting: Family Medicine

## 2023-07-14 LAB — CYTOLOGY - PAP
Comment: NEGATIVE
Comment: NEGATIVE
Comment: NEGATIVE
Diagnosis: NEGATIVE
Diagnosis: REACTIVE
HPV 16: NEGATIVE
HPV 18 / 45: NEGATIVE
High risk HPV: POSITIVE — AB

## 2023-07-14 NOTE — Progress Notes (Signed)
 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  This may clear on own.  Repeat pap in 1 year

## 2023-07-15 ENCOUNTER — Telehealth: Payer: Self-pay | Admitting: *Deleted

## 2023-07-15 NOTE — Telephone Encounter (Signed)
 Copied from CRM [id number]. 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  This may clear on own.  Repeat pap in 1 year

## 2023-07-16 ENCOUNTER — Other Ambulatory Visit: Payer: Self-pay | Admitting: Family Medicine

## 2023-07-16 DIAGNOSIS — Z9889 Other specified postprocedural states: Secondary | ICD-10-CM

## 2023-07-16 DIAGNOSIS — B977 Papillomavirus as the cause of diseases classified elsewhere: Secondary | ICD-10-CM

## 2023-07-16 NOTE — Progress Notes (Signed)
 Spoke w/pt.  H/o leep 30 yrs ago.  Same partner >40 yrs.  Pap 2021 normal and hpv neg.  Now, pap normal but HR HPV-not 16/18.  Refer gyn

## 2023-07-16 NOTE — Telephone Encounter (Signed)
Pt would like a call back concerning the message below.

## 2023-08-03 DIAGNOSIS — R87810 Cervical high risk human papillomavirus (HPV) DNA test positive: Secondary | ICD-10-CM | POA: Diagnosis not present

## 2023-08-03 DIAGNOSIS — N952 Postmenopausal atrophic vaginitis: Secondary | ICD-10-CM | POA: Diagnosis not present

## 2023-08-03 DIAGNOSIS — R87610 Atypical squamous cells of undetermined significance on cytologic smear of cervix (ASC-US): Secondary | ICD-10-CM | POA: Diagnosis not present

## 2023-08-23 ENCOUNTER — Ambulatory Visit: Payer: No Typology Code available for payment source

## 2023-10-14 DIAGNOSIS — M1711 Unilateral primary osteoarthritis, right knee: Secondary | ICD-10-CM | POA: Diagnosis not present

## 2023-10-14 DIAGNOSIS — Z96652 Presence of left artificial knee joint: Secondary | ICD-10-CM | POA: Diagnosis not present

## 2023-10-22 DIAGNOSIS — H18453 Nodular corneal degeneration, bilateral: Secondary | ICD-10-CM | POA: Diagnosis not present

## 2023-11-10 DIAGNOSIS — M1711 Unilateral primary osteoarthritis, right knee: Secondary | ICD-10-CM | POA: Diagnosis not present

## 2023-11-24 ENCOUNTER — Telehealth: Payer: Self-pay | Admitting: Family Medicine

## 2023-11-24 NOTE — Telephone Encounter (Signed)
 [facility] faxed  Surgical Clearance, to be filled out by provider. Patient requested to send it back via Fax within ASAP. Document is located in providers tray at front office.Please advise at [phone].

## 2023-11-26 NOTE — Telephone Encounter (Signed)
 Patient notified of message below. Patient stated she is going to [facility] to have blood work done, does not feel it is necessary to come in for a visit with pcp. Form has been completed and faxed to [facility].

## 2023-11-26 NOTE — Telephone Encounter (Signed)
 Left message to return call to office.

## 2023-11-29 ENCOUNTER — Telehealth: Payer: Self-pay | Admitting: *Deleted

## 2023-11-29 NOTE — Telephone Encounter (Signed)
 Copied from CRM [id number]. Topic: Medical Record Request - Records Request >> Nov 29, 2023 12:00 PM Shereese L wrote: Reason for CRM: sherry from [facility] is requesting last office notes & updated Labs for surgery clearance. Surgery date 10/06 Fax# [phone]  Form faxed with confirmation on 11/26/23. Left detailed message for Joen to return my call. Patient declined having an office visit and labs done here. See previous messages. Patient stated she would go to [facility] and do blood work.

## 2023-11-30 DIAGNOSIS — M179 Osteoarthritis of knee, unspecified: Secondary | ICD-10-CM | POA: Diagnosis not present

## 2023-12-08 ENCOUNTER — Ambulatory Visit (INDEPENDENT_AMBULATORY_CARE_PROVIDER_SITE_OTHER)

## 2023-12-08 VITALS — Ht 69.0 in | Wt 175.0 lb

## 2023-12-08 DIAGNOSIS — Z0000 Encounter for general adult medical examination without abnormal findings: Secondary | ICD-10-CM

## 2023-12-08 NOTE — Progress Notes (Signed)
 Subjective:   Sabrina Harper is a 68 y.o. who presents for a Medicare Wellness preventive visit.  As a reminder, Annual Wellness Visits don't include a physical exam, and some assessments may be limited, especially if this visit is performed virtually. We may recommend an in-person follow-up visit with your provider if needed.  Visit Complete: Virtual I connected with  Sabrina Harper on 12/08/23 by a audio enabled telemedicine application and verified that I am speaking with the correct person using two identifiers.  Patient Location: Home  Provider Location: Home Office  I discussed the limitations of evaluation and management by telemedicine. The patient expressed understanding and agreed to proceed.  Vital Signs: Because this visit was a virtual/telehealth visit, some criteria may be missing or patient reported. Any vitals not documented were not able to be obtained and vitals that have been documented are patient reported.  VideoDeclined- This patient declined Librarian, academic. Therefore the visit was completed with audio only.  Persons Participating in Visit: Patient.  AWV Questionnaire: Yes: Patient Medicare AWV questionnaire was completed by the patient on 12/04/23; I have confirmed that all information answered by patient is correct and no changes since this date.  Cardiac Risk Factors include: advanced age (>37men, >85 women)     Objective:    Today's Vitals   12/08/23 0839  Weight: 175 lb (79.4 kg)  Height: 5' 9 (1.753 m)   Body mass index is 25.84 kg/m.     12/08/2023    8:48 AM 08/17/2022    8:40 AM 06/02/2022    7:55 AM 05/22/2022    9:33 AM 01/27/2022    5:40 AM 01/15/2022    9:13 AM  Advanced Directives  Does Patient Have a Medical Advance Directive? Yes Yes Yes Yes Yes Yes  Type of Estate agent of [place];Living will Healthcare Power of [place];Living will Healthcare Power of [place];Living will  Living will;Healthcare Power of [employer] Power of [employer] Power of [place];Living will  Does patient want to make changes to medical advance directive?   No - Patient declined  No - Patient declined   Copy of Healthcare Power of Attorney in Chart? No - copy requested No - copy requested No - copy requested  No - copy requested No - copy requested    Current Medications (verified) Outpatient Encounter Medications as of 12/08/2023  Medication Sig   EPINEPHrine  0.3 mg/0.3 mL IJ SOAJ injection Inject 0.3 mg into the muscle once as needed (anaphylaxis/allergic reaction).   methocarbamol  (ROBAXIN ) 500 MG tablet Take 1 tablet by mouth every 6 hours as needed for muscle spasm   Multiple Vitamin (MULTIVITAMIN ADULT PO)    oxyCODONE  (OXY IR/ROXICODONE ) 5 MG immediate release tablet Take 5 mg by mouth every 4 (four) hours as needed for severe pain (pain score 7-10).   traMADol (ULTRAM) 50 MG tablet TAKE 1 TO 2 TABLETS BY MOUTH EVERY 6 HOURS AS NEEDED FOR MODERATE PAIN   amoxicillin (AMOXIL) 500 MG capsule TAKE 4 CAPSULES BY MOUTH, 1 HOUR BEFORE DENTAL PROCEDURE, SAVE REMAINING FOR FUTURE APPTS (Patient not taking: Reported on 12/08/2023)   aspirin 81 MG chewable tablet Chew 1 tablet twice a day by oral route for 4 weeks for 30 days. (Patient not taking: Reported on 12/08/2023)   Calcium Carbonate (CALCIUM 500 PO)  (Patient not taking: Reported on 12/08/2023)   celecoxib  (CELEBREX ) 200 MG capsule Take by mouth 2 (two) times daily. (Patient not taking: Reported on 12/08/2023)  Cholecalciferol (VITAMIN D -3 PO)    ondansetron  (ZOFRAN -ODT) 4 MG disintegrating tablet Take by mouth. (Patient not taking: Reported on 12/08/2023)   senna (SENNA-TIME) 8.6 MG tablet Take 2 tablets every day by oral route at bedtime for 14 days. (Patient not taking: Reported on 12/08/2023)   tranexamic acid  (LYSTEDA ) 650 MG TABS tablet Take by mouth. (Patient not taking: Reported on 12/08/2023)   [DISCONTINUED]  Acetaminophen  (TYLENOL  PO) Take 500 mg by mouth at bedtime. 2 tablets at bedtime   No facility-administered encounter medications on file as of 12/08/2023.    Allergies (verified) Other, Black cohosh, and Nitrofurantoin   History: Past Medical History:  Diagnosis Date   Arthritis    CIN II (cervical intraepithelial neoplasia II) 1994   Family history of adverse reaction to anesthesia    Mom nausea   GERD (gastroesophageal reflux disease)    Hx of adenomatous colonic polyps 07/17/2020   2 diminutive adenomas   Ringing of ears    Skin cancer    Eyelid   Past Surgical History:  Procedure Laterality Date   AUGMENTATION MAMMAPLASTY  2002   -saline   CERVICAL BIOPSY  W/ LOOP ELECTRODE EXCISION  1994   CIN II   CERVICAL CONE BIOPSY  1994   CESAREAN SECTION     x2, 1982, 1988   COLONOSCOPY  12/30/2006   [facility]    HYSTEROSCOPY  2000   resection of polyp   KNEE ARTHROSCOPY Right 2006   KNEE ARTHROSCOPY WITH MEDIAL MENISECTOMY Left 01/27/2022   Procedure: KNEE ARTHROSCOPY WITH MEDIAL MENISECTOMY, medial condoplasty;  Surgeon: Ernie Cough, MD;  Location: [facility];  Service: Orthopedics;  Laterality: Left;   SKIN CANCER EXCISION     Eyelid   TOTAL KNEE ARTHROPLASTY Left 06/02/2022   Procedure: TOTAL KNEE ARTHROPLASTY;  Surgeon: Ernie Cough, MD;  Location: [facility];  Service: Orthopedics;  Laterality: Left;   TUBAL LIGATION     Family History  Problem Relation Age of Onset   Osteoporosis Mother    Hypertension Mother    Kidney cancer Father        deceased   Heart attack Brother 70   Hypertension Brother    Hypertension Brother    Hypertension Maternal Grandmother    Heart attack Maternal Grandfather    Arthritis Paternal Grandmother    Colon cancer Neg Hx    Colon polyps Neg Hx    Esophageal cancer Neg Hx    Rectal cancer Neg Hx    Stomach cancer Neg Hx    Social History   Socioeconomic History   Marital status: Married    Spouse name: Not on file   Number of  children: 3   Years of education: Not on file   Highest education level: Bachelor's degree (e.g., BA, AB, BS)  Occupational History   Not on file  Tobacco Use   Smoking status: Never   Smokeless tobacco: Never  Vaping Use   Vaping status: Never Used  Substance and Sexual Activity   Alcohol use: Yes    Comment: wine socially   Drug use: Never   Sexual activity: Yes    Partners: Male    Birth control/protection: Surgical    Comment: Tubal  Other Topics Concern   Not on file  Social History Narrative   5 grandsons   Retired-med [employer]   Social Drivers of Health   Financial Resource Strain: Low Risk  (12/04/2023)   Overall Financial Resource Strain (CARDIA)    Difficulty of Paying  Living Expenses: Not very hard  Food Insecurity: No Food Insecurity (12/04/2023)   Hunger Vital Sign    Worried About Running Out of Food in the Last Year: Never true    Ran Out of Food in the Last Year: Never true  Transportation Needs: No Transportation Needs (12/04/2023)   PRAPARE - Administrator, Civil Service (Medical): No    Lack of Transportation (Non-Medical): No  Physical Activity: Insufficiently Active (12/04/2023)   Exercise Vital Sign    Days of Exercise per Week: 3 days    Minutes of Exercise per Session: 20 min  Stress: No Stress Concern Present (12/04/2023)   [employer] of Occupational Health - Occupational Stress Questionnaire    Feeling of Stress: Not at all  Social Connections: Moderately Integrated (12/04/2023)   Social Connection and Isolation Panel    Frequency of Communication with Friends and Family: More than three times a week    Frequency of Social Gatherings with Friends and Family: More than three times a week    Attends Religious Services: More than 4 times per year    Active Member of [employer] or Organizations: No    Attends Engineer, structural: Not on file    Marital Status: Married    Tobacco Counseling Counseling given: Not  Answered    Clinical Intake:  Pre-visit preparation completed: Yes  Pain : No/denies pain     BMI - recorded: 25.84 Nutritional Status: BMI 25 -29 Overweight Nutritional Risks: None Diabetes: No  No results found for: HGBA1C   How often do you need to have someone help you when you read instructions, pamphlets, or other written materials from your doctor or pharmacy?: 1 - Never  Interpreter Needed?: No  Information entered by :: Ellouise Haws, LPN   Activities of Daily Living      12/04/2023    1:10 PM  In your present state of health, do you have any difficulty performing the following activities:  Hearing? 0  Vision? 0  Difficulty concentrating or making decisions? 0  Walking or climbing stairs? 0  Dressing or bathing? 0  Doing errands, shopping? 0  Preparing Food and eating ? N  Using the Toilet? N  In the past six months, have you accidently leaked urine? N  Do you have problems with loss of bowel control? N  Managing your Medications? N  Managing your Finances? N  Housekeeping or managing your Housekeeping? N    Patient Care Team: Wendolyn Jenkins Jansky, MD as PCP - General (Family Medicine)   I have updated your Care Teams any recent Medical Services you may have received from other providers in the past year.     Assessment:   This is a routine wellness examination for [place].  Hearing/Vision screen Hearing Screening - Comments:: Denies any hearing issues with tinnitus  Vision Screening - Comments:: Wears rx glasses - up to date with routine eye exams with Dr Lauraine Fass [facility]    Goals Addressed             This Visit's Progress    Patient Stated       Get back to activity and walking after surgery        Depression Screen      12/08/2023    8:41 AM 08/17/2022    8:37 AM 05/14/2022   10:51 AM 06/12/2021   10:04 AM  PHQ 2/9 Scores  PHQ - 2 Score 0 0 0 0  PHQ-  9 Score   1 1    Fall Risk     12/04/2023    1:10 PM 08/17/2022     8:41 AM 05/14/2022   10:51 AM 06/12/2021    9:52 AM  Fall Risk   Falls in the past year? 0 0 0 0  Number falls in past yr:  0 0 0  Injury with Fall?  0 0 0  Risk for fall due to : No Fall Risks Impaired vision No Fall Risks   Follow up Falls prevention discussed Falls prevention discussed Falls evaluation completed     MEDICARE RISK AT HOME:  Medicare Risk at Home Any stairs in or around the home?: (Patient-Rptd) Yes If so, are there any without handrails?: (Patient-Rptd) No Home free of loose throw rugs in walkways, pet beds, electrical cords, etc?: (Patient-Rptd) No Adequate lighting in your home to reduce risk of falls?: (Patient-Rptd) Yes Life alert?: (Patient-Rptd) No Use of a cane, walker or w/c?: (Patient-Rptd) No Grab bars in the bathroom?: (Patient-Rptd) No Shower chair or bench in shower?: (Patient-Rptd) Yes Elevated toilet seat or a handicapped toilet?: (Patient-Rptd) Yes  TIMED UP AND GO:  Was the test performed?  No  Cognitive Function: 6CIT completed        12/08/2023    8:48 AM 08/17/2022    8:41 AM  6CIT Screen  What Year? 0 points 0 points  What month? 0 points 0 points  What time? 0 points 0 points  Count back from 20 0 points 0 points  Months in reverse 0 points 0 points  Repeat phrase 0 points 0 points  Total Score 0 points 0 points    Immunizations Immunization History  Administered Date(s) Administered   DTaP 11/08/2006   Influenza Inj Mdck Quad Pf 12/14/2018   Influenza, Seasonal, Injecte, Preservative Fre 01/24/2017, 12/31/2017, 01/20/2018   Influenza,inj,Quad PF,6+ Mos 12/31/2017, 12/14/2018, 12/20/2019   Influenza-Unspecified 01/24/2017, 12/31/2017, 12/31/2017, 01/20/2018, 12/29/2018   PFIZER(Purple Top)SARS-COV-2 Vaccination 05/24/2019, 06/14/2019, 02/14/2020   Tdap 10/23/2013   Zoster Recombinant(Shingrix) 12/31/2017, 03/04/2018    Screening Tests Health Maintenance  Topic Date Due   Pneumococcal Vaccine: 50+ Years (1 of 1 - PCV)  Never done   Influenza Vaccine  10/08/2023   DTaP/Tdap/Td (3 - Td or Tdap) 10/24/2023   COVID-19 Vaccine (4 - 2025-26 season) 11/08/2023   Medicare Annual Wellness (AWV)  12/07/2024   Mammogram  07/01/2025   Colonoscopy  07/09/2027   DEXA SCAN  Completed   Hepatitis C Screening  Completed   Zoster Vaccines- Shingrix  Completed   HPV VACCINES  Aged Out   Meningococcal B Vaccine  Aged Out    Health Maintenance Items Addressed: See Nurse Notes at the end of this note  Additional Screening:  Vision Screening: Recommended annual ophthalmology exams for early detection of glaucoma and other disorders of the eye. Is the patient up to date with their annual eye exam?  Yes  Who is the provider or what is the name of the office in which the patient attends annual eye exams? Dr Lauraine Fass   Dental Screening: Recommended annual dental exams for proper oral hygiene  Community Resource Referral / Chronic Care Management: CRR required this visit?  No   CCM required this visit?  No   Plan:    I have personally reviewed and noted the following in the patient's chart:   Medical and social history Use of alcohol, tobacco or illicit drugs  Current medications and supplements including opioid prescriptions.  Patient is not currently taking opioid prescriptions. Functional ability and status Nutritional status Physical activity Advanced directives List of other physicians Hospitalizations, surgeries, and ER visits in previous 12 months Vitals Screenings to include cognitive, depression, and falls Referrals and appointments  In addition, I have reviewed and discussed with patient certain preventive protocols, quality metrics, and best practice recommendations. A written personalized care plan for preventive services as well as general preventive health recommendations were provided to patient.   Ellouise VEAR Haws, LPN   [id number]/[id number]   After Visit Summary: (MyChart) Due to this being a  telephonic visit, the after visit summary with patients personalized plan was offered to patient via MyChart   Notes: Nothing significant to report at this time.

## 2023-12-08 NOTE — Patient Instructions (Signed)
 Ms. Jr,  Thank you for taking the time for your Medicare Wellness Visit. I appreciate your continued commitment to your health goals. Please review the care plan we discussed, and feel free to reach out if I can assist you further.  Medicare recommends these wellness visits once per year to help you and your care team stay ahead of potential health issues. These visits are designed to focus on prevention, allowing your provider to concentrate on managing your acute and chronic conditions during your regular appointments.  Please note that Annual Wellness Visits do not include a physical exam. Some assessments may be limited, especially if the visit was conducted virtually. If needed, we may recommend a separate in-person follow-up with your provider.  Ongoing Care Seeing your primary care provider every 3 to 6 months helps us  monitor your health and provide consistent, personalized care.   Referrals If a referral was made during today's visit and you haven't received any updates within two weeks, please contact the referred provider directly to check on the status.  Recommended Screenings:  Health Maintenance  Topic Date Due   Pneumococcal Vaccine for age over 46 (1 of 1 - PCV) Never done   Medicare Annual Wellness Visit  08/17/2023   Flu Shot  10/08/2023   DTaP/Tdap/Td vaccine (3 - Td or Tdap) 10/24/2023   COVID-19 Vaccine (4 - 2025-26 season) 11/08/2023   Breast Cancer Screening  07/01/2025   Colon Cancer Screening  07/09/2027   DEXA scan (bone density measurement)  Completed   Hepatitis C Screening  Completed   Zoster (Shingles) Vaccine  Completed   HPV Vaccine  Aged Out   Meningitis B Vaccine  Aged Out       12/08/2023    8:48 AM  Advanced Directives  Does Patient Have a Medical Advance Directive? Yes  Type of Estate agent of [place];Living will  Copy of Healthcare Power of Attorney in Chart? No - copy requested   Advance Care Planning is  important because it: Ensures you receive medical care that aligns with your values, goals, and preferences. Provides guidance to your family and loved ones, reducing the emotional burden of decision-making during critical moments.  Vision: Annual vision screenings are recommended for early detection of glaucoma, cataracts, and diabetic retinopathy. These exams can also reveal signs of chronic conditions such as diabetes and high blood pressure.  Dental: Annual dental screenings help detect early signs of oral cancer, gum disease, and other conditions linked to overall health, including heart disease and diabetes.  Please see the attached documents for additional preventive care recommendations.

## 2023-12-08 NOTE — Telephone Encounter (Unsigned)
 Copied from CRM #[id number]. Topic: Medical Record Request - Other >> Dec 08, 2023  8:50 AM Mia F wrote: Reason for CRM: Pt is coming in for surgery and last office note is needed. Pt surgery is on 10/6 note is needed today. [facility] in [phone]

## 2023-12-08 NOTE — Telephone Encounter (Signed)
 Called [facility] and left VM for [facility] with [facility] about paperwork that was faxed on 11/26/23.

## 2023-12-09 NOTE — Telephone Encounter (Unsigned)
 Copied from CRM [id number]. Topic: Medical Record Request - Other >> Dec 09, 2023  9:38 AM Deaijah H wrote: Reason for CRM: Randine w/ [facility] called in due to not receiving last OV notes. Fax number [phone]. Patient appointment next Tuesday.

## 2023-12-13 DIAGNOSIS — M1711 Unilateral primary osteoarthritis, right knee: Secondary | ICD-10-CM | POA: Diagnosis not present

## 2023-12-13 DIAGNOSIS — G8918 Other acute postprocedural pain: Secondary | ICD-10-CM | POA: Diagnosis not present

## 2023-12-16 DIAGNOSIS — M25561 Pain in right knee: Secondary | ICD-10-CM | POA: Diagnosis not present

## 2023-12-21 DIAGNOSIS — M25561 Pain in right knee: Secondary | ICD-10-CM | POA: Diagnosis not present

## 2023-12-23 DIAGNOSIS — M25561 Pain in right knee: Secondary | ICD-10-CM | POA: Diagnosis not present

## 2023-12-27 DIAGNOSIS — M25561 Pain in right knee: Secondary | ICD-10-CM | POA: Diagnosis not present

## 2024-07-11 ENCOUNTER — Encounter: Admitting: Family Medicine

## 2024-12-12 ENCOUNTER — Ambulatory Visit
